# Patient Record
Sex: Female | Born: 1955 | Race: Black or African American | Hispanic: No | Smoking: Former smoker
Health system: Southern US, Community
[De-identification: ages and names within clinical notes are randomized; demographics above are authoritative.]

## PROBLEM LIST (undated history)

## (undated) DIAGNOSIS — E119 Type 2 diabetes mellitus without complications: Secondary | ICD-10-CM

## (undated) DIAGNOSIS — I10 Essential (primary) hypertension: Secondary | ICD-10-CM

## (undated) DIAGNOSIS — J449 Chronic obstructive pulmonary disease, unspecified: Secondary | ICD-10-CM

## (undated) DIAGNOSIS — I480 Paroxysmal atrial fibrillation: Secondary | ICD-10-CM

## (undated) DIAGNOSIS — I509 Heart failure, unspecified: Secondary | ICD-10-CM

## (undated) DIAGNOSIS — I272 Pulmonary hypertension, unspecified: Secondary | ICD-10-CM

## (undated) DIAGNOSIS — I2781 Cor pulmonale (chronic): Secondary | ICD-10-CM

## (undated) HISTORY — PX: OTHER SURGICAL HISTORY: SHX169

---

## 2017-07-14 ENCOUNTER — Inpatient Hospital Stay
Admission: RE | Admit: 2017-07-14 | Discharge: 2017-08-05 | Disposition: A | Discharge status: Home Health | Payer: Medicaid Other | Source: Other Acute Inpatient Hospital | Attending: Internal Medicine | Admitting: Internal Medicine

## 2017-07-14 ENCOUNTER — Other Ambulatory Visit (HOSPITAL_COMMUNITY): Payer: Medicaid Other

## 2017-07-14 DIAGNOSIS — J811 Chronic pulmonary edema: Secondary | ICD-10-CM

## 2017-07-14 DIAGNOSIS — R6889 Other general symptoms and signs: Secondary | ICD-10-CM

## 2017-07-14 DIAGNOSIS — J9690 Respiratory failure, unspecified, unspecified whether with hypoxia or hypercapnia: Secondary | ICD-10-CM

## 2017-07-15 ENCOUNTER — Other Ambulatory Visit (HOSPITAL_COMMUNITY): Payer: Medicaid Other

## 2017-07-15 LAB — CBC WITH DIFFERENTIAL/PLATELET
BASOS PCT: 0 %
Basophils Absolute: 0 10*3/uL (ref 0.0–0.1)
EOS ABS: 0.1 10*3/uL (ref 0.0–0.7)
Eosinophils Relative: 0 %
HCT: 42.2 % (ref 36.0–46.0)
HEMOGLOBIN: 12.2 g/dL (ref 12.0–15.0)
Lymphocytes Relative: 18 %
Lymphs Abs: 2.4 10*3/uL (ref 0.7–4.0)
MCH: 27.4 pg (ref 26.0–34.0)
MCHC: 28.9 g/dL — AB (ref 30.0–36.0)
MCV: 94.8 fL (ref 78.0–100.0)
Monocytes Absolute: 1.1 10*3/uL — ABNORMAL HIGH (ref 0.1–1.0)
Monocytes Relative: 8 %
NEUTROS PCT: 74 %
Neutro Abs: 10.4 10*3/uL — ABNORMAL HIGH (ref 1.7–7.7)
Platelets: 181 10*3/uL (ref 150–400)
RBC: 4.45 MIL/uL (ref 3.87–5.11)
RDW: 16.4 % — ABNORMAL HIGH (ref 11.5–15.5)
WBC: 13.9 10*3/uL — AB (ref 4.0–10.5)

## 2017-07-15 LAB — COMPREHENSIVE METABOLIC PANEL
ALK PHOS: 45 U/L (ref 38–126)
ALT: 16 U/L (ref 14–54)
ANION GAP: 10 (ref 5–15)
AST: 13 U/L — ABNORMAL LOW (ref 15–41)
Albumin: 3.3 g/dL — ABNORMAL LOW (ref 3.5–5.0)
BUN: 25 mg/dL — ABNORMAL HIGH (ref 6–20)
CALCIUM: 8.9 mg/dL (ref 8.9–10.3)
CO2: 36 mmol/L — ABNORMAL HIGH (ref 22–32)
CREATININE: 0.69 mg/dL (ref 0.44–1.00)
Chloride: 97 mmol/L — ABNORMAL LOW (ref 101–111)
Glucose, Bld: 167 mg/dL — ABNORMAL HIGH (ref 65–99)
Potassium: 3.4 mmol/L — ABNORMAL LOW (ref 3.5–5.1)
Sodium: 143 mmol/L (ref 135–145)
Total Bilirubin: 0.8 mg/dL (ref 0.3–1.2)
Total Protein: 6 g/dL — ABNORMAL LOW (ref 6.5–8.1)

## 2017-07-15 LAB — PROTIME-INR
INR: 0.95
PROTHROMBIN TIME: 12.6 s (ref 11.4–15.2)

## 2017-07-15 LAB — MAGNESIUM: Magnesium: 2.3 mg/dL (ref 1.7–2.4)

## 2017-07-16 LAB — PHOSPHORUS: Phosphorus: 4.2 mg/dL (ref 2.5–4.6)

## 2017-07-16 LAB — CBC
HCT: 40.7 % (ref 36.0–46.0)
Hemoglobin: 11.5 g/dL — ABNORMAL LOW (ref 12.0–15.0)
MCH: 26.9 pg (ref 26.0–34.0)
MCHC: 28.3 g/dL — ABNORMAL LOW (ref 30.0–36.0)
MCV: 95.3 fL (ref 78.0–100.0)
PLATELETS: 147 10*3/uL — AB (ref 150–400)
RBC: 4.27 MIL/uL (ref 3.87–5.11)
RDW: 16.6 % — AB (ref 11.5–15.5)
WBC: 12.4 10*3/uL — ABNORMAL HIGH (ref 4.0–10.5)

## 2017-07-16 LAB — BASIC METABOLIC PANEL
Anion gap: 14 (ref 5–15)
BUN: 26 mg/dL — AB (ref 6–20)
CO2: 35 mmol/L — AB (ref 22–32)
Calcium: 8.8 mg/dL — ABNORMAL LOW (ref 8.9–10.3)
Chloride: 94 mmol/L — ABNORMAL LOW (ref 101–111)
Creatinine, Ser: 0.69 mg/dL (ref 0.44–1.00)
GFR calc Af Amer: >60 mL/min (ref >60)
GLUCOSE: 78 mg/dL (ref 65–99)
Potassium: 4.1 mmol/L (ref 3.5–5.1)
Sodium: 143 mmol/L (ref 135–145)

## 2017-07-16 LAB — MAGNESIUM: Magnesium: 2.2 mg/dL (ref 1.7–2.4)

## 2017-07-16 LAB — TSH: TSH: 1.02 u[IU]/mL (ref 0.350–4.500)

## 2017-07-20 LAB — RENAL FUNCTION PANEL
ALBUMIN: 2.9 g/dL — AB (ref 3.5–5.0)
ANION GAP: 12 (ref 5–15)
BUN: 22 mg/dL — AB (ref 6–20)
CHLORIDE: 95 mmol/L — AB (ref 101–111)
CO2: 33 mmol/L — ABNORMAL HIGH (ref 22–32)
Calcium: 8.6 mg/dL — ABNORMAL LOW (ref 8.9–10.3)
Creatinine, Ser: 0.77 mg/dL (ref 0.44–1.00)
GFR calc Af Amer: >60 mL/min (ref >60)
Glucose, Bld: 127 mg/dL — ABNORMAL HIGH (ref 65–99)
PHOSPHORUS: 3.8 mg/dL (ref 2.5–4.6)
POTASSIUM: 3.7 mmol/L (ref 3.5–5.1)
Sodium: 140 mmol/L (ref 135–145)

## 2017-07-20 LAB — CBC
HEMATOCRIT: 36.9 % (ref 36.0–46.0)
HEMOGLOBIN: 10.9 g/dL — AB (ref 12.0–15.0)
MCH: 27.9 pg (ref 26.0–34.0)
MCHC: 29.5 g/dL — AB (ref 30.0–36.0)
MCV: 94.6 fL (ref 78.0–100.0)
Platelets: 157 10*3/uL (ref 150–400)
RBC: 3.9 MIL/uL (ref 3.87–5.11)
RDW: 16.6 % — AB (ref 11.5–15.5)
WBC: 9.7 10*3/uL (ref 4.0–10.5)

## 2017-07-20 LAB — MAGNESIUM: MAGNESIUM: 2.3 mg/dL (ref 1.7–2.4)

## 2017-07-20 LAB — DIGOXIN LEVEL: Digoxin Level: 0.8 ng/mL (ref 0.8–2.0)

## 2017-07-23 LAB — BASIC METABOLIC PANEL
Anion gap: 9 (ref 5–15)
BUN: 13 mg/dL (ref 6–20)
CALCIUM: 8.8 mg/dL — AB (ref 8.9–10.3)
CO2: 31 mmol/L (ref 22–32)
Chloride: 101 mmol/L (ref 101–111)
Creatinine, Ser: 0.64 mg/dL (ref 0.44–1.00)
GFR calc Af Amer: >60 mL/min (ref >60)
GLUCOSE: 128 mg/dL — AB (ref 65–99)
Potassium: 3.7 mmol/L (ref 3.5–5.1)
Sodium: 141 mmol/L (ref 135–145)

## 2017-07-24 ENCOUNTER — Other Ambulatory Visit (HOSPITAL_COMMUNITY): Payer: Medicaid Other

## 2017-07-25 ENCOUNTER — Other Ambulatory Visit (HOSPITAL_COMMUNITY): Payer: Medicaid Other

## 2017-07-25 ENCOUNTER — Other Ambulatory Visit (HOSPITAL_BASED_OUTPATIENT_CLINIC_OR_DEPARTMENT_OTHER): Payer: Medicare Other

## 2017-07-25 DIAGNOSIS — I340 Nonrheumatic mitral (valve) insufficiency: Secondary | ICD-10-CM | POA: Diagnosis not present

## 2017-07-25 LAB — CBC
HCT: 34.5 % — ABNORMAL LOW (ref 36.0–46.0)
Hemoglobin: 10.2 g/dL — ABNORMAL LOW (ref 12.0–15.0)
MCH: 27.9 pg (ref 26.0–34.0)
MCHC: 29.6 g/dL — AB (ref 30.0–36.0)
MCV: 94.5 fL (ref 78.0–100.0)
PLATELETS: 143 10*3/uL — AB (ref 150–400)
RBC: 3.65 MIL/uL — ABNORMAL LOW (ref 3.87–5.11)
RDW: 15.9 % — AB (ref 11.5–15.5)
WBC: 7.7 10*3/uL (ref 4.0–10.5)

## 2017-07-25 LAB — BASIC METABOLIC PANEL
ANION GAP: 14 (ref 5–15)
BUN: 16 mg/dL (ref 6–20)
CALCIUM: 8.7 mg/dL — AB (ref 8.9–10.3)
CO2: 29 mmol/L (ref 22–32)
CREATININE: 0.75 mg/dL (ref 0.44–1.00)
Chloride: 99 mmol/L — ABNORMAL LOW (ref 101–111)
GFR calc Af Amer: >60 mL/min (ref >60)
GLUCOSE: 181 mg/dL — AB (ref 65–99)
Potassium: 4.2 mmol/L (ref 3.5–5.1)
Sodium: 142 mmol/L (ref 135–145)

## 2017-07-25 NOTE — Progress Notes (Signed)
  Echocardiogram 2D Echocardiogram has been performed.  Vanessa Alexander 07/25/2017, 5:05 PM

## 2017-07-27 LAB — BASIC METABOLIC PANEL
Anion gap: 7 (ref 5–15)
BUN: 24 mg/dL — AB (ref 6–20)
CALCIUM: 9.4 mg/dL (ref 8.9–10.3)
CO2: 38 mmol/L — ABNORMAL HIGH (ref 22–32)
Chloride: 98 mmol/L — ABNORMAL LOW (ref 101–111)
Creatinine, Ser: 0.69 mg/dL (ref 0.44–1.00)
GFR calc Af Amer: >60 mL/min (ref >60)
Glucose, Bld: 121 mg/dL — ABNORMAL HIGH (ref 65–99)
POTASSIUM: 4 mmol/L (ref 3.5–5.1)
Sodium: 143 mmol/L (ref 135–145)

## 2017-07-27 LAB — BRAIN NATRIURETIC PEPTIDE: B NATRIURETIC PEPTIDE 5: 165.8 pg/mL — AB (ref 0.0–100.0)

## 2017-07-27 LAB — MAGNESIUM: Magnesium: 2.4 mg/dL (ref 1.7–2.4)

## 2017-07-28 MED ORDER — FUROSEMIDE 10 MG/ML IJ SOLN
20.00 | INTRAMUSCULAR | Status: DC
Start: 2017-07-14 — End: 2017-07-28

## 2017-07-28 MED ORDER — IPRATROPIUM BROMIDE 0.02 % IN SOLN
.50 | RESPIRATORY_TRACT | Status: DC
Start: 2017-07-14 — End: 2017-07-28

## 2017-07-28 MED ORDER — HYDRALAZINE HCL 20 MG/ML IJ SOLN
10.00 | INTRAMUSCULAR | Status: DC
Start: ? — End: 2017-07-28

## 2017-07-28 MED ORDER — ATORVASTATIN CALCIUM 40 MG PO TABS
40.00 | ORAL_TABLET | ORAL | Status: DC
Start: 2017-07-14 — End: 2017-07-28

## 2017-07-28 MED ORDER — HYDROXYZINE HCL 25 MG PO TABS
50.00 | ORAL_TABLET | ORAL | Status: DC
Start: ? — End: 2017-07-28

## 2017-07-28 MED ORDER — TRAZODONE HCL 100 MG PO TABS
100.00 | ORAL_TABLET | ORAL | Status: DC
Start: ? — End: 2017-07-28

## 2017-07-28 MED ORDER — ACETAMINOPHEN 325 MG PO TABS
650.00 | ORAL_TABLET | ORAL | Status: DC
Start: ? — End: 2017-07-28

## 2017-07-28 MED ORDER — ENOXAPARIN SODIUM 40 MG/0.4ML ~~LOC~~ SOLN
40.00 | SUBCUTANEOUS | Status: DC
Start: 2017-07-14 — End: 2017-07-28

## 2017-07-28 MED ORDER — ASPIRIN EC 81 MG PO TBEC
81.00 | DELAYED_RELEASE_TABLET | ORAL | Status: DC
Start: 2017-07-15 — End: 2017-07-28

## 2017-07-28 MED ORDER — DIGOXIN 125 MCG PO TABS
0.13 | ORAL_TABLET | ORAL | Status: DC
Start: 2017-07-15 — End: 2017-07-28

## 2017-07-28 MED ORDER — FLUTICASONE FUROATE-VILANTEROL 100-25 MCG/INH IN AEPB
1.00 | INHALATION_SPRAY | RESPIRATORY_TRACT | Status: DC
Start: 2017-07-15 — End: 2017-07-28

## 2017-07-28 MED ORDER — GLUCOSE 40 % PO GEL
15.00 g | ORAL | Status: DC
Start: ? — End: 2017-07-28

## 2017-07-28 MED ORDER — MAGNESIUM OXIDE 400 MG PO TABS
400.00 | ORAL_TABLET | ORAL | Status: DC
Start: 2017-07-14 — End: 2017-07-28

## 2017-07-28 MED ORDER — ONDANSETRON HCL 4 MG/2ML IJ SOLN
4.00 | INTRAMUSCULAR | Status: DC
Start: ? — End: 2017-07-28

## 2017-07-28 MED ORDER — ALBUTEROL SULFATE HFA 108 (90 BASE) MCG/ACT IN AERS
2.00 | INHALATION_SPRAY | RESPIRATORY_TRACT | Status: DC
Start: ? — End: 2017-07-28

## 2017-07-28 MED ORDER — GENERIC EXTERNAL MEDICATION
Status: DC
Start: ? — End: 2017-07-28

## 2017-07-28 MED ORDER — GENERIC EXTERNAL MEDICATION
1.00 | Status: DC
Start: ? — End: 2017-07-28

## 2017-07-28 MED ORDER — INSULIN LISPRO 100 UNIT/ML ~~LOC~~ SOLN
2.00 | SUBCUTANEOUS | Status: DC
Start: 2017-07-14 — End: 2017-07-28

## 2017-07-28 MED ORDER — AMLODIPINE BESYLATE 5 MG PO TABS
5.00 | ORAL_TABLET | ORAL | Status: DC
Start: 2017-07-15 — End: 2017-07-28

## 2017-07-28 MED ORDER — ALBUTEROL SULFATE (5 MG/ML) 0.5% IN NEBU
2.50 | INHALATION_SOLUTION | RESPIRATORY_TRACT | Status: DC
Start: 2017-07-14 — End: 2017-07-28

## 2017-07-28 MED ORDER — BUSPIRONE HCL 10 MG PO TABS
10.00 | ORAL_TABLET | ORAL | Status: DC
Start: 2017-07-14 — End: 2017-07-28

## 2017-07-28 MED ORDER — GLIPIZIDE ER 5 MG PO TB24
5.00 | ORAL_TABLET | ORAL | Status: DC
Start: 2017-07-15 — End: 2017-07-28

## 2017-07-28 MED ORDER — UMECLIDINIUM BROMIDE 62.5 MCG/INH IN AEPB
1.00 | INHALATION_SPRAY | RESPIRATORY_TRACT | Status: DC
Start: 2017-07-15 — End: 2017-07-28

## 2017-07-28 MED ORDER — DEXTROSE 50 % IV SOLN
12.00 g | INTRAVENOUS | Status: DC
Start: ? — End: 2017-07-28

## 2017-07-28 MED ORDER — METOPROLOL SUCCINATE ER 25 MG PO TB24
12.50 | ORAL_TABLET | ORAL | Status: DC
Start: 2017-07-15 — End: 2017-07-28

## 2017-07-29 ENCOUNTER — Other Ambulatory Visit (HOSPITAL_COMMUNITY): Payer: Medicaid Other

## 2017-07-29 LAB — BASIC METABOLIC PANEL
ANION GAP: 10 (ref 5–15)
BUN: 24 mg/dL — AB (ref 6–20)
CALCIUM: 9.1 mg/dL (ref 8.9–10.3)
CO2: 36 mmol/L — ABNORMAL HIGH (ref 22–32)
CREATININE: 0.77 mg/dL (ref 0.44–1.00)
Chloride: 99 mmol/L — ABNORMAL LOW (ref 101–111)
GFR calc Af Amer: >60 mL/min (ref >60)
GLUCOSE: 114 mg/dL — AB (ref 65–99)
Potassium: 3.6 mmol/L (ref 3.5–5.1)
Sodium: 145 mmol/L (ref 135–145)

## 2017-07-29 LAB — CBC
HCT: 36.3 % (ref 36.0–46.0)
Hemoglobin: 10.3 g/dL — ABNORMAL LOW (ref 12.0–15.0)
MCH: 27.5 pg (ref 26.0–34.0)
MCHC: 28.4 g/dL — ABNORMAL LOW (ref 30.0–36.0)
MCV: 97.1 fL (ref 78.0–100.0)
PLATELETS: 227 10*3/uL (ref 150–400)
RBC: 3.74 MIL/uL — ABNORMAL LOW (ref 3.87–5.11)
RDW: 15.8 % — AB (ref 11.5–15.5)
WBC: 9.2 10*3/uL (ref 4.0–10.5)

## 2017-07-30 LAB — BLOOD GAS, ARTERIAL
Acid-Base Excess: 15 mmol/L — ABNORMAL HIGH (ref 0.0–2.0)
BICARBONATE: 40.3 mmol/L — AB (ref 20.0–28.0)
Drawn by: 358491
FIO2: 80
O2 Content: 10 L/min
O2 Saturation: 96.9 %
PCO2 ART: 62.4 mmHg — AB (ref 32.0–48.0)
PH ART: 7.426 (ref 7.350–7.450)
PO2 ART: 91.7 mmHg (ref 83.0–108.0)
Patient temperature: 98.6

## 2017-07-31 LAB — RENAL FUNCTION PANEL
Albumin: 2.7 g/dL — ABNORMAL LOW (ref 3.5–5.0)
Anion gap: 8 (ref 5–15)
BUN: 21 mg/dL — AB (ref 6–20)
CHLORIDE: 95 mmol/L — AB (ref 101–111)
CO2: 39 mmol/L — AB (ref 22–32)
CREATININE: 0.65 mg/dL (ref 0.44–1.00)
Calcium: 8.9 mg/dL (ref 8.9–10.3)
GFR calc Af Amer: >60 mL/min (ref >60)
GFR calc non Af Amer: >60 mL/min (ref >60)
Glucose, Bld: 135 mg/dL — ABNORMAL HIGH (ref 65–99)
Phosphorus: 3.1 mg/dL (ref 2.5–4.6)
Potassium: 3.5 mmol/L (ref 3.5–5.1)
SODIUM: 142 mmol/L (ref 135–145)

## 2017-07-31 LAB — CBC
HCT: 33.4 % — ABNORMAL LOW (ref 36.0–46.0)
HEMOGLOBIN: 9.8 g/dL — AB (ref 12.0–15.0)
MCH: 27.9 pg (ref 26.0–34.0)
MCHC: 29.3 g/dL — AB (ref 30.0–36.0)
MCV: 95.2 fL (ref 78.0–100.0)
Platelets: 232 10*3/uL (ref 150–400)
RBC: 3.51 MIL/uL — ABNORMAL LOW (ref 3.87–5.11)
RDW: 15.7 % — AB (ref 11.5–15.5)
WBC: 8.3 10*3/uL (ref 4.0–10.5)

## 2017-07-31 LAB — MAGNESIUM: MAGNESIUM: 2.1 mg/dL (ref 1.7–2.4)

## 2017-07-31 LAB — DIGOXIN LEVEL: DIGOXIN LVL: 0.6 ng/mL — AB (ref 0.8–2.0)

## 2017-08-01 ENCOUNTER — Other Ambulatory Visit (HOSPITAL_COMMUNITY): Payer: Medicaid Other

## 2017-08-01 LAB — BASIC METABOLIC PANEL
Anion gap: 9 (ref 5–15)
BUN: 18 mg/dL (ref 6–20)
CALCIUM: 8.8 mg/dL — AB (ref 8.9–10.3)
CO2: 37 mmol/L — AB (ref 22–32)
CREATININE: 0.62 mg/dL (ref 0.44–1.00)
Chloride: 96 mmol/L — ABNORMAL LOW (ref 101–111)
Glucose, Bld: 114 mg/dL — ABNORMAL HIGH (ref 65–99)
Potassium: 3.5 mmol/L (ref 3.5–5.1)
SODIUM: 142 mmol/L (ref 135–145)

## 2017-08-01 LAB — TSH: TSH: 0.308 u[IU]/mL — ABNORMAL LOW (ref 0.350–4.500)

## 2017-08-01 LAB — HEMOGLOBIN A1C
HEMOGLOBIN A1C: 7.2 % — AB (ref 4.8–5.6)
Mean Plasma Glucose: 159.94 mg/dL

## 2017-08-01 LAB — MAGNESIUM: Magnesium: 2.2 mg/dL (ref 1.7–2.4)

## 2017-08-01 MED ORDER — MAGNESIUM OXIDE 400 MG PO TABS
400.00 | ORAL_TABLET | ORAL | Status: DC
Start: 2017-07-14 — End: 2017-08-01

## 2017-08-01 MED ORDER — GENERIC EXTERNAL MEDICATION
Status: DC
Start: ? — End: 2017-08-01

## 2017-08-04 LAB — CBC
HCT: 39.4 % (ref 36.0–46.0)
Hemoglobin: 11.3 g/dL — ABNORMAL LOW (ref 12.0–15.0)
MCH: 27.8 pg (ref 26.0–34.0)
MCHC: 28.7 g/dL — AB (ref 30.0–36.0)
MCV: 96.8 fL (ref 78.0–100.0)
PLATELETS: 260 10*3/uL (ref 150–400)
RBC: 4.07 MIL/uL (ref 3.87–5.11)
RDW: 16.2 % — AB (ref 11.5–15.5)
WBC: 11.5 10*3/uL — AB (ref 4.0–10.5)

## 2017-08-04 LAB — BASIC METABOLIC PANEL
ANION GAP: 8 (ref 5–15)
BUN: 20 mg/dL (ref 6–20)
CALCIUM: 9.5 mg/dL (ref 8.9–10.3)
CO2: 39 mmol/L — ABNORMAL HIGH (ref 22–32)
CREATININE: 0.77 mg/dL (ref 0.44–1.00)
Chloride: 97 mmol/L — ABNORMAL LOW (ref 101–111)
GLUCOSE: 111 mg/dL — AB (ref 65–99)
Potassium: 3.6 mmol/L (ref 3.5–5.1)
Sodium: 144 mmol/L (ref 135–145)

## 2017-08-04 LAB — MAGNESIUM: Magnesium: 2.3 mg/dL (ref 1.7–2.4)

## 2017-09-09 DIAGNOSIS — J9620 Acute and chronic respiratory failure, unspecified whether with hypoxia or hypercapnia: Secondary | ICD-10-CM

## 2017-09-09 DIAGNOSIS — J181 Lobar pneumonia, unspecified organism: Secondary | ICD-10-CM

## 2017-09-09 DIAGNOSIS — I5032 Chronic diastolic (congestive) heart failure: Secondary | ICD-10-CM

## 2017-09-09 DIAGNOSIS — J449 Chronic obstructive pulmonary disease, unspecified: Secondary | ICD-10-CM | POA: Diagnosis not present

## 2017-09-10 DIAGNOSIS — I5032 Chronic diastolic (congestive) heart failure: Secondary | ICD-10-CM

## 2017-09-10 DIAGNOSIS — J449 Chronic obstructive pulmonary disease, unspecified: Secondary | ICD-10-CM | POA: Diagnosis not present

## 2017-09-10 DIAGNOSIS — J181 Lobar pneumonia, unspecified organism: Secondary | ICD-10-CM

## 2017-09-10 DIAGNOSIS — J9620 Acute and chronic respiratory failure, unspecified whether with hypoxia or hypercapnia: Secondary | ICD-10-CM

## 2017-09-11 DIAGNOSIS — I5032 Chronic diastolic (congestive) heart failure: Secondary | ICD-10-CM

## 2017-09-11 DIAGNOSIS — J181 Lobar pneumonia, unspecified organism: Secondary | ICD-10-CM

## 2017-09-11 DIAGNOSIS — J9620 Acute and chronic respiratory failure, unspecified whether with hypoxia or hypercapnia: Secondary | ICD-10-CM

## 2017-09-11 DIAGNOSIS — J449 Chronic obstructive pulmonary disease, unspecified: Secondary | ICD-10-CM | POA: Diagnosis not present

## 2017-09-12 DIAGNOSIS — J449 Chronic obstructive pulmonary disease, unspecified: Secondary | ICD-10-CM

## 2017-09-12 DIAGNOSIS — J9620 Acute and chronic respiratory failure, unspecified whether with hypoxia or hypercapnia: Secondary | ICD-10-CM | POA: Diagnosis not present

## 2017-09-12 DIAGNOSIS — I5032 Chronic diastolic (congestive) heart failure: Secondary | ICD-10-CM

## 2017-09-12 DIAGNOSIS — J181 Lobar pneumonia, unspecified organism: Secondary | ICD-10-CM

## 2017-09-13 DIAGNOSIS — I5032 Chronic diastolic (congestive) heart failure: Secondary | ICD-10-CM

## 2017-09-13 DIAGNOSIS — J181 Lobar pneumonia, unspecified organism: Secondary | ICD-10-CM | POA: Diagnosis not present

## 2017-09-13 DIAGNOSIS — J449 Chronic obstructive pulmonary disease, unspecified: Secondary | ICD-10-CM

## 2017-09-13 DIAGNOSIS — J9620 Acute and chronic respiratory failure, unspecified whether with hypoxia or hypercapnia: Secondary | ICD-10-CM

## 2017-09-14 DIAGNOSIS — J181 Lobar pneumonia, unspecified organism: Secondary | ICD-10-CM

## 2017-09-14 DIAGNOSIS — J449 Chronic obstructive pulmonary disease, unspecified: Secondary | ICD-10-CM

## 2017-09-14 DIAGNOSIS — I5032 Chronic diastolic (congestive) heart failure: Secondary | ICD-10-CM

## 2017-09-14 DIAGNOSIS — J9620 Acute and chronic respiratory failure, unspecified whether with hypoxia or hypercapnia: Secondary | ICD-10-CM

## 2017-09-15 DIAGNOSIS — J181 Lobar pneumonia, unspecified organism: Secondary | ICD-10-CM

## 2017-09-15 DIAGNOSIS — J9620 Acute and chronic respiratory failure, unspecified whether with hypoxia or hypercapnia: Secondary | ICD-10-CM

## 2017-09-15 DIAGNOSIS — I5032 Chronic diastolic (congestive) heart failure: Secondary | ICD-10-CM | POA: Diagnosis not present

## 2017-09-15 DIAGNOSIS — J449 Chronic obstructive pulmonary disease, unspecified: Secondary | ICD-10-CM

## 2017-09-23 DIAGNOSIS — J9620 Acute and chronic respiratory failure, unspecified whether with hypoxia or hypercapnia: Secondary | ICD-10-CM

## 2017-09-23 DIAGNOSIS — J181 Lobar pneumonia, unspecified organism: Secondary | ICD-10-CM

## 2017-09-23 DIAGNOSIS — I5032 Chronic diastolic (congestive) heart failure: Secondary | ICD-10-CM

## 2017-09-23 DIAGNOSIS — J449 Chronic obstructive pulmonary disease, unspecified: Secondary | ICD-10-CM

## 2017-09-24 DIAGNOSIS — J9620 Acute and chronic respiratory failure, unspecified whether with hypoxia or hypercapnia: Secondary | ICD-10-CM

## 2017-09-24 DIAGNOSIS — J181 Lobar pneumonia, unspecified organism: Secondary | ICD-10-CM | POA: Diagnosis not present

## 2017-09-24 DIAGNOSIS — J449 Chronic obstructive pulmonary disease, unspecified: Secondary | ICD-10-CM | POA: Diagnosis not present

## 2017-09-24 DIAGNOSIS — I5032 Chronic diastolic (congestive) heart failure: Secondary | ICD-10-CM | POA: Diagnosis not present

## 2017-09-25 DIAGNOSIS — I5032 Chronic diastolic (congestive) heart failure: Secondary | ICD-10-CM | POA: Diagnosis not present

## 2017-09-25 DIAGNOSIS — J449 Chronic obstructive pulmonary disease, unspecified: Secondary | ICD-10-CM

## 2017-09-25 DIAGNOSIS — J181 Lobar pneumonia, unspecified organism: Secondary | ICD-10-CM

## 2017-09-25 DIAGNOSIS — J9620 Acute and chronic respiratory failure, unspecified whether with hypoxia or hypercapnia: Secondary | ICD-10-CM

## 2017-09-26 DIAGNOSIS — J181 Lobar pneumonia, unspecified organism: Secondary | ICD-10-CM | POA: Diagnosis not present

## 2017-09-26 DIAGNOSIS — J9620 Acute and chronic respiratory failure, unspecified whether with hypoxia or hypercapnia: Secondary | ICD-10-CM | POA: Diagnosis not present

## 2017-09-26 DIAGNOSIS — J449 Chronic obstructive pulmonary disease, unspecified: Secondary | ICD-10-CM

## 2017-09-26 DIAGNOSIS — I5032 Chronic diastolic (congestive) heart failure: Secondary | ICD-10-CM

## 2017-09-27 DIAGNOSIS — I5032 Chronic diastolic (congestive) heart failure: Secondary | ICD-10-CM | POA: Diagnosis not present

## 2017-09-27 DIAGNOSIS — J181 Lobar pneumonia, unspecified organism: Secondary | ICD-10-CM

## 2017-09-27 DIAGNOSIS — J449 Chronic obstructive pulmonary disease, unspecified: Secondary | ICD-10-CM

## 2017-09-27 DIAGNOSIS — J9620 Acute and chronic respiratory failure, unspecified whether with hypoxia or hypercapnia: Secondary | ICD-10-CM

## 2017-09-28 DIAGNOSIS — J181 Lobar pneumonia, unspecified organism: Secondary | ICD-10-CM

## 2017-09-28 DIAGNOSIS — J9620 Acute and chronic respiratory failure, unspecified whether with hypoxia or hypercapnia: Secondary | ICD-10-CM | POA: Diagnosis not present

## 2017-09-28 DIAGNOSIS — I5032 Chronic diastolic (congestive) heart failure: Secondary | ICD-10-CM | POA: Diagnosis not present

## 2017-09-28 DIAGNOSIS — J449 Chronic obstructive pulmonary disease, unspecified: Secondary | ICD-10-CM | POA: Diagnosis not present

## 2017-09-29 DIAGNOSIS — J449 Chronic obstructive pulmonary disease, unspecified: Secondary | ICD-10-CM

## 2017-09-29 DIAGNOSIS — J181 Lobar pneumonia, unspecified organism: Secondary | ICD-10-CM | POA: Diagnosis not present

## 2017-09-29 DIAGNOSIS — J9620 Acute and chronic respiratory failure, unspecified whether with hypoxia or hypercapnia: Secondary | ICD-10-CM

## 2017-09-29 DIAGNOSIS — I5032 Chronic diastolic (congestive) heart failure: Secondary | ICD-10-CM

## 2017-10-10 ENCOUNTER — Encounter (HOSPITAL_COMMUNITY): Payer: Self-pay

## 2017-10-10 ENCOUNTER — Other Ambulatory Visit: Payer: Self-pay

## 2017-10-10 ENCOUNTER — Emergency Department (HOSPITAL_COMMUNITY): Payer: Medicare Other

## 2017-10-10 ENCOUNTER — Inpatient Hospital Stay (HOSPITAL_COMMUNITY)
Admission: EM | Admit: 2017-10-10 | Discharge: 2017-10-22 | DRG: 193 | Disposition: A | Discharge status: Hospice | Payer: Medicare Other | Attending: Internal Medicine | Admitting: Internal Medicine

## 2017-10-10 DIAGNOSIS — E870 Hyperosmolality and hypernatremia: Secondary | ICD-10-CM | POA: Diagnosis not present

## 2017-10-10 DIAGNOSIS — Z515 Encounter for palliative care: Secondary | ICD-10-CM | POA: Diagnosis not present

## 2017-10-10 DIAGNOSIS — B953 Streptococcus pneumoniae as the cause of diseases classified elsewhere: Secondary | ICD-10-CM | POA: Diagnosis not present

## 2017-10-10 DIAGNOSIS — I119 Hypertensive heart disease without heart failure: Secondary | ICD-10-CM | POA: Diagnosis present

## 2017-10-10 DIAGNOSIS — N179 Acute kidney failure, unspecified: Secondary | ICD-10-CM | POA: Diagnosis present

## 2017-10-10 DIAGNOSIS — L98429 Non-pressure chronic ulcer of back with unspecified severity: Secondary | ICD-10-CM | POA: Diagnosis present

## 2017-10-10 DIAGNOSIS — I481 Persistent atrial fibrillation: Secondary | ICD-10-CM | POA: Diagnosis present

## 2017-10-10 DIAGNOSIS — I2723 Pulmonary hypertension due to lung diseases and hypoxia: Secondary | ICD-10-CM | POA: Diagnosis present

## 2017-10-10 DIAGNOSIS — E11649 Type 2 diabetes mellitus with hypoglycemia without coma: Secondary | ICD-10-CM | POA: Diagnosis not present

## 2017-10-10 DIAGNOSIS — I509 Heart failure, unspecified: Secondary | ICD-10-CM | POA: Diagnosis not present

## 2017-10-10 DIAGNOSIS — I2781 Cor pulmonale (chronic): Secondary | ICD-10-CM | POA: Diagnosis present

## 2017-10-10 DIAGNOSIS — I2722 Pulmonary hypertension due to left heart disease: Secondary | ICD-10-CM | POA: Diagnosis present

## 2017-10-10 DIAGNOSIS — J439 Emphysema, unspecified: Secondary | ICD-10-CM | POA: Diagnosis present

## 2017-10-10 DIAGNOSIS — Z79899 Other long term (current) drug therapy: Secondary | ICD-10-CM

## 2017-10-10 DIAGNOSIS — Z794 Long term (current) use of insulin: Secondary | ICD-10-CM

## 2017-10-10 DIAGNOSIS — Z7901 Long term (current) use of anticoagulants: Secondary | ICD-10-CM

## 2017-10-10 DIAGNOSIS — Z7952 Long term (current) use of systemic steroids: Secondary | ICD-10-CM

## 2017-10-10 DIAGNOSIS — T501X5A Adverse effect of loop [high-ceiling] diuretics, initial encounter: Secondary | ICD-10-CM | POA: Diagnosis present

## 2017-10-10 DIAGNOSIS — J189 Pneumonia, unspecified organism: Secondary | ICD-10-CM | POA: Diagnosis present

## 2017-10-10 DIAGNOSIS — J9622 Acute and chronic respiratory failure with hypercapnia: Secondary | ICD-10-CM | POA: Diagnosis present

## 2017-10-10 DIAGNOSIS — E118 Type 2 diabetes mellitus with unspecified complications: Secondary | ICD-10-CM | POA: Diagnosis not present

## 2017-10-10 DIAGNOSIS — Z9981 Dependence on supplemental oxygen: Secondary | ICD-10-CM

## 2017-10-10 DIAGNOSIS — J430 Unilateral pulmonary emphysema [MacLeod's syndrome]: Secondary | ICD-10-CM | POA: Diagnosis not present

## 2017-10-10 DIAGNOSIS — R0602 Shortness of breath: Secondary | ICD-10-CM | POA: Diagnosis present

## 2017-10-10 DIAGNOSIS — E872 Acidosis, unspecified: Secondary | ICD-10-CM | POA: Diagnosis present

## 2017-10-10 DIAGNOSIS — Z87891 Personal history of nicotine dependence: Secondary | ICD-10-CM

## 2017-10-10 DIAGNOSIS — J9620 Acute and chronic respiratory failure, unspecified whether with hypoxia or hypercapnia: Secondary | ICD-10-CM | POA: Diagnosis present

## 2017-10-10 DIAGNOSIS — E119 Type 2 diabetes mellitus without complications: Secondary | ICD-10-CM

## 2017-10-10 DIAGNOSIS — N183 Chronic kidney disease, stage 3 (moderate): Secondary | ICD-10-CM

## 2017-10-10 DIAGNOSIS — J811 Chronic pulmonary edema: Secondary | ICD-10-CM | POA: Diagnosis present

## 2017-10-10 DIAGNOSIS — I10 Essential (primary) hypertension: Secondary | ICD-10-CM | POA: Diagnosis present

## 2017-10-10 DIAGNOSIS — E1165 Type 2 diabetes mellitus with hyperglycemia: Secondary | ICD-10-CM | POA: Diagnosis present

## 2017-10-10 DIAGNOSIS — Y95 Nosocomial condition: Secondary | ICD-10-CM | POA: Diagnosis present

## 2017-10-10 DIAGNOSIS — I5033 Acute on chronic diastolic (congestive) heart failure: Secondary | ICD-10-CM | POA: Diagnosis present

## 2017-10-10 DIAGNOSIS — J449 Chronic obstructive pulmonary disease, unspecified: Secondary | ICD-10-CM | POA: Diagnosis present

## 2017-10-10 DIAGNOSIS — Z7951 Long term (current) use of inhaled steroids: Secondary | ICD-10-CM

## 2017-10-10 DIAGNOSIS — R748 Abnormal levels of other serum enzymes: Secondary | ICD-10-CM

## 2017-10-10 DIAGNOSIS — J9621 Acute and chronic respiratory failure with hypoxia: Secondary | ICD-10-CM | POA: Diagnosis present

## 2017-10-10 DIAGNOSIS — I471 Supraventricular tachycardia: Secondary | ICD-10-CM | POA: Diagnosis present

## 2017-10-10 DIAGNOSIS — E875 Hyperkalemia: Secondary | ICD-10-CM | POA: Diagnosis not present

## 2017-10-10 DIAGNOSIS — Z66 Do not resuscitate: Secondary | ICD-10-CM | POA: Diagnosis not present

## 2017-10-10 DIAGNOSIS — J13 Pneumonia due to Streptococcus pneumoniae: Secondary | ICD-10-CM | POA: Diagnosis present

## 2017-10-10 DIAGNOSIS — E871 Hypo-osmolality and hyponatremia: Secondary | ICD-10-CM | POA: Diagnosis not present

## 2017-10-10 DIAGNOSIS — Z7189 Other specified counseling: Secondary | ICD-10-CM | POA: Diagnosis not present

## 2017-10-10 DIAGNOSIS — R7881 Bacteremia: Secondary | ICD-10-CM | POA: Diagnosis present

## 2017-10-10 DIAGNOSIS — R7989 Other specified abnormal findings of blood chemistry: Secondary | ICD-10-CM | POA: Diagnosis present

## 2017-10-10 DIAGNOSIS — I361 Nonrheumatic tricuspid (valve) insufficiency: Secondary | ICD-10-CM | POA: Diagnosis not present

## 2017-10-10 DIAGNOSIS — R778 Other specified abnormalities of plasma proteins: Secondary | ICD-10-CM | POA: Diagnosis present

## 2017-10-10 DIAGNOSIS — J9601 Acute respiratory failure with hypoxia: Secondary | ICD-10-CM

## 2017-10-10 DIAGNOSIS — Z8701 Personal history of pneumonia (recurrent): Secondary | ICD-10-CM

## 2017-10-10 DIAGNOSIS — F419 Anxiety disorder, unspecified: Secondary | ICD-10-CM | POA: Diagnosis present

## 2017-10-10 DIAGNOSIS — I248 Other forms of acute ischemic heart disease: Secondary | ICD-10-CM | POA: Diagnosis present

## 2017-10-10 DIAGNOSIS — R0600 Dyspnea, unspecified: Secondary | ICD-10-CM | POA: Diagnosis not present

## 2017-10-10 DIAGNOSIS — E1169 Type 2 diabetes mellitus with other specified complication: Secondary | ICD-10-CM | POA: Diagnosis not present

## 2017-10-10 DIAGNOSIS — I959 Hypotension, unspecified: Secondary | ICD-10-CM | POA: Diagnosis not present

## 2017-10-10 DIAGNOSIS — I480 Paroxysmal atrial fibrillation: Secondary | ICD-10-CM | POA: Diagnosis present

## 2017-10-10 DIAGNOSIS — F39 Unspecified mood [affective] disorder: Secondary | ICD-10-CM | POA: Diagnosis present

## 2017-10-10 DIAGNOSIS — Z885 Allergy status to narcotic agent status: Secondary | ICD-10-CM

## 2017-10-10 DIAGNOSIS — R0902 Hypoxemia: Secondary | ICD-10-CM

## 2017-10-10 DIAGNOSIS — J810 Acute pulmonary edema: Secondary | ICD-10-CM | POA: Diagnosis not present

## 2017-10-10 DIAGNOSIS — Z888 Allergy status to other drugs, medicaments and biological substances status: Secondary | ICD-10-CM

## 2017-10-10 HISTORY — DX: Essential (primary) hypertension: I10

## 2017-10-10 HISTORY — DX: Cor pulmonale (chronic): I27.81

## 2017-10-10 HISTORY — DX: Pulmonary hypertension, unspecified: I27.20

## 2017-10-10 HISTORY — DX: Chronic obstructive pulmonary disease, unspecified: J44.9

## 2017-10-10 HISTORY — DX: Type 2 diabetes mellitus without complications: E11.9

## 2017-10-10 HISTORY — DX: Paroxysmal atrial fibrillation: I48.0

## 2017-10-10 HISTORY — DX: Heart failure, unspecified: I50.9

## 2017-10-10 LAB — BLOOD GAS, VENOUS
Acid-Base Excess: 6.1 mmol/L — ABNORMAL HIGH (ref 0.0–2.0)
Bicarbonate: 33.3 mmol/L — ABNORMAL HIGH (ref 20.0–28.0)
Delivery systems: POSITIVE
Drawn by: 257881
FIO2: 40
MODE: POSITIVE
O2 Saturation: 34.4 %
PEEP/CPAP: 8 cmH2O
PH VEN: 7.319 (ref 7.250–7.430)
PRESSURE SUPPORT: 16 cmH2O
Patient temperature: 98.6
pCO2, Ven: 66.7 mmHg — ABNORMAL HIGH (ref 44.0–60.0)

## 2017-10-10 LAB — TROPONIN I
TROPONIN I: 0.1 ng/mL — AB (ref <0.03)
TROPONIN I: 0.1 ng/mL — AB (ref <0.03)
Troponin I: 0.1 ng/mL (ref <0.03)

## 2017-10-10 LAB — BLOOD GAS, ARTERIAL
Acid-Base Excess: 7.5 mmol/L — ABNORMAL HIGH (ref 0.0–2.0)
Acid-Base Excess: 5.9 mmol/L — ABNORMAL HIGH (ref 0.0–2.0)
BICARBONATE: 31.7 mmol/L — AB (ref 20.0–28.0)
Bicarbonate: 33.6 mmol/L — ABNORMAL HIGH (ref 20.0–28.0)
DELIVERY SYSTEMS: POSITIVE
DRAWN BY: 295031
Delivery systems: POSITIVE
Drawn by: 257881
Expiratory PAP: 8
Expiratory PAP: 8
FIO2: 60
FIO2: 50
INSPIRATORY PAP: 16
Inspiratory PAP: 16
MODE: POSITIVE
Mode: POSITIVE
O2 Saturation: 89.7 %
O2 Saturation: 88.3 %
PATIENT TEMPERATURE: 98.6
PCO2 ART: 56.9 mmHg — AB (ref 32.0–48.0)
PO2 ART: 63.7 mmHg — AB (ref 83.0–108.0)
Patient temperature: 98.6
pCO2 arterial: 55.8 mmHg — ABNORMAL HIGH (ref 32.0–48.0)
pH, Arterial: 7.389 (ref 7.350–7.450)
pH, Arterial: 7.374 (ref 7.350–7.450)
pO2, Arterial: 68.3 mmHg — ABNORMAL LOW (ref 83.0–108.0)

## 2017-10-10 LAB — COMPREHENSIVE METABOLIC PANEL
ALK PHOS: 107 U/L (ref 38–126)
ALT: 38 U/L (ref 0–44)
AST: 27 U/L (ref 15–41)
Albumin: 3.1 g/dL — ABNORMAL LOW (ref 3.5–5.0)
Anion gap: 15 (ref 5–15)
BILIRUBIN TOTAL: 1.1 mg/dL (ref 0.3–1.2)
BUN: 38 mg/dL — ABNORMAL HIGH (ref 8–23)
CALCIUM: 8.7 mg/dL — AB (ref 8.9–10.3)
CO2: 31 mmol/L (ref 22–32)
CREATININE: 1.24 mg/dL — AB (ref 0.44–1.00)
Chloride: 95 mmol/L — ABNORMAL LOW (ref 98–111)
GFR calc non Af Amer: 45 mL/min — ABNORMAL LOW (ref >60)
GFR, EST AFRICAN AMERICAN: 52 mL/min — AB (ref >60)
Glucose, Bld: 247 mg/dL — ABNORMAL HIGH (ref 70–99)
Potassium: 5.2 mmol/L — ABNORMAL HIGH (ref 3.5–5.1)
Sodium: 141 mmol/L (ref 135–145)
TOTAL PROTEIN: 6.8 g/dL (ref 6.5–8.1)

## 2017-10-10 LAB — CBC WITH DIFFERENTIAL/PLATELET
Basophils Absolute: 0 10*3/uL (ref 0.0–0.1)
Basophils Relative: 0 %
EOS ABS: 0 10*3/uL (ref 0.0–0.7)
Eosinophils Relative: 0 %
HEMATOCRIT: 36.3 % (ref 36.0–46.0)
HEMOGLOBIN: 10.8 g/dL — AB (ref 12.0–15.0)
LYMPHS ABS: 0.7 10*3/uL (ref 0.7–4.0)
LYMPHS PCT: 3 %
MCH: 28.3 pg (ref 26.0–34.0)
MCHC: 29.8 g/dL — AB (ref 30.0–36.0)
MCV: 95.3 fL (ref 78.0–100.0)
MONOS PCT: 5 %
Monocytes Absolute: 1.1 10*3/uL — ABNORMAL HIGH (ref 0.1–1.0)
NEUTROS ABS: 20.6 10*3/uL — AB (ref 1.7–7.7)
NEUTROS PCT: 92 %
Platelets: 181 10*3/uL (ref 150–400)
RBC: 3.81 MIL/uL — AB (ref 3.87–5.11)
RDW: 16.4 % — ABNORMAL HIGH (ref 11.5–15.5)
WBC: 22.4 10*3/uL — AB (ref 4.0–10.5)

## 2017-10-10 LAB — GLUCOSE, CAPILLARY
Glucose-Capillary: 284 mg/dL — ABNORMAL HIGH (ref 70–99)
Glucose-Capillary: 228 mg/dL — ABNORMAL HIGH (ref 70–99)

## 2017-10-10 LAB — I-STAT CG4 LACTIC ACID, ED: LACTIC ACID, VENOUS: 2.92 mmol/L — AB (ref 0.5–1.9)

## 2017-10-10 LAB — BRAIN NATRIURETIC PEPTIDE: B NATRIURETIC PEPTIDE 5: 726.3 pg/mL — AB (ref 0.0–100.0)

## 2017-10-10 LAB — PROCALCITONIN: Procalcitonin: 2.25 ng/mL

## 2017-10-10 MED ORDER — ATORVASTATIN CALCIUM 40 MG PO TABS
40.0000 mg | ORAL_TABLET | Freq: Every day | ORAL | Status: DC
  Administered 2017-10-11 – 2017-10-22 (×10): 40 mg via ORAL
  Filled 2017-10-10 (×11): qty 1

## 2017-10-10 MED ORDER — ONDANSETRON HCL 4 MG/2ML IJ SOLN: 4.0000 mg | Freq: Four times a day (QID) | INTRAMUSCULAR | Status: DC | PRN

## 2017-10-10 MED ORDER — POTASSIUM CHLORIDE CRYS ER 20 MEQ PO TBCR: 40.0000 meq | EXTENDED_RELEASE_TABLET | Freq: Every day | ORAL | Status: DC

## 2017-10-10 MED ORDER — HALOPERIDOL LACTATE 5 MG/ML IJ SOLN: 2.5000 mg | Freq: Once | INTRAMUSCULAR | Status: DC

## 2017-10-10 MED ORDER — ENOXAPARIN SODIUM 40 MG/0.4ML ~~LOC~~ SOLN
40.0000 mg | SUBCUTANEOUS | Status: DC
  Administered 2017-10-10 – 2017-10-19 (×9): 40 mg via SUBCUTANEOUS
  Filled 2017-10-10 (×9): qty 0.4

## 2017-10-10 MED ORDER — LORAZEPAM 2 MG/ML IJ SOLN: 0.5000 mg | Freq: Once | INTRAMUSCULAR | Status: DC

## 2017-10-10 MED ORDER — ARFORMOTEROL TARTRATE 15 MCG/2ML IN NEBU
15.0000 ug | INHALATION_SOLUTION | Freq: Two times a day (BID) | RESPIRATORY_TRACT | Status: DC
  Administered 2017-10-10 – 2017-10-22 (×24): 15 ug via RESPIRATORY_TRACT
  Filled 2017-10-10 (×24): qty 2

## 2017-10-10 MED ORDER — DILTIAZEM HCL ER COATED BEADS 180 MG PO CP24
180.0000 mg | ORAL_CAPSULE | Freq: Every day | ORAL | Status: DC
Start: 2017-10-10 — End: 2017-10-17
  Administered 2017-10-10 – 2017-10-16 (×7): 180 mg via ORAL
  Filled 2017-10-10 (×7): qty 1

## 2017-10-10 MED ORDER — SODIUM CHLORIDE 0.9 % IV BOLUS
250.0000 mL | Freq: Once | INTRAVENOUS | Status: AC
  Administered 2017-10-10: 250 mL via INTRAVENOUS

## 2017-10-10 MED ORDER — DIGOXIN 125 MCG PO TABS
0.1250 mg | ORAL_TABLET | Freq: Every day | ORAL | Status: DC
  Administered 2017-10-10 – 2017-10-22 (×11): 0.125 mg via ORAL
  Filled 2017-10-10 (×11): qty 1

## 2017-10-10 MED ORDER — METHYLPREDNISOLONE SODIUM SUCC 125 MG IJ SOLR
60.0000 mg | Freq: Two times a day (BID) | INTRAMUSCULAR | Status: DC
  Administered 2017-10-10 – 2017-10-14 (×8): 60 mg via INTRAVENOUS
  Filled 2017-10-10 (×8): qty 2

## 2017-10-10 MED ORDER — ACETAMINOPHEN 325 MG PO TABS: 650.0000 mg | ORAL_TABLET | Freq: Four times a day (QID) | ORAL | Status: DC | PRN

## 2017-10-10 MED ORDER — FUROSEMIDE 10 MG/ML IJ SOLN
40.0000 mg | Freq: Two times a day (BID) | INTRAMUSCULAR | Status: DC
  Administered 2017-10-10 – 2017-10-11 (×3): 40 mg via INTRAVENOUS
  Filled 2017-10-10 (×3): qty 4

## 2017-10-10 MED ORDER — MORPHINE SULFATE 15 MG PO TABS
7.5000 mg | ORAL_TABLET | Freq: Two times a day (BID) | ORAL | Status: DC | PRN
  Administered 2017-10-11: 7.5 mg via ORAL
  Filled 2017-10-10: qty 1

## 2017-10-10 MED ORDER — BUDESONIDE 0.5 MG/2ML IN SUSP
0.5000 mg | Freq: Two times a day (BID) | RESPIRATORY_TRACT | Status: DC
  Administered 2017-10-10 – 2017-10-22 (×24): 0.5 mg via RESPIRATORY_TRACT
  Filled 2017-10-10 (×24): qty 2

## 2017-10-10 MED ORDER — ALBUTEROL (5 MG/ML) CONTINUOUS INHALATION SOLN
10.0000 mg/h | INHALATION_SOLUTION | Freq: Once | RESPIRATORY_TRACT | Status: AC
  Administered 2017-10-10: 10 mg/h via RESPIRATORY_TRACT
  Filled 2017-10-10: qty 20

## 2017-10-10 MED ORDER — SODIUM CHLORIDE 0.9 % IV SOLN
2.0000 g | Freq: Once | INTRAVENOUS | Status: AC
  Administered 2017-10-10: 2 g via INTRAVENOUS
  Filled 2017-10-10: qty 2

## 2017-10-10 MED ORDER — VANCOMYCIN HCL 10 G IV SOLR
1500.0000 mg | Freq: Once | INTRAVENOUS | Status: AC
  Administered 2017-10-10: 1500 mg via INTRAVENOUS
  Filled 2017-10-10: qty 1500

## 2017-10-10 MED ORDER — ONDANSETRON HCL 4 MG PO TABS: 4.0000 mg | ORAL_TABLET | Freq: Four times a day (QID) | ORAL | Status: DC | PRN

## 2017-10-10 MED ORDER — SENNOSIDES-DOCUSATE SODIUM 8.6-50 MG PO TABS
2.0000 | ORAL_TABLET | Freq: Every day | ORAL | Status: DC | PRN
  Administered 2017-10-17: 2 via ORAL
  Filled 2017-10-10: qty 2

## 2017-10-10 MED ORDER — PANTOPRAZOLE SODIUM 40 MG PO TBEC
40.0000 mg | DELAYED_RELEASE_TABLET | Freq: Two times a day (BID) | ORAL | Status: DC
  Administered 2017-10-10 – 2017-10-19 (×18): 40 mg via ORAL
  Filled 2017-10-10 (×18): qty 1

## 2017-10-10 MED ORDER — BUSPIRONE HCL 10 MG PO TABS
10.0000 mg | ORAL_TABLET | Freq: Three times a day (TID) | ORAL | Status: DC
Start: 2017-10-10 — End: 2017-10-17
  Administered 2017-10-10 – 2017-10-17 (×21): 10 mg via ORAL
  Filled 2017-10-10 (×21): qty 1

## 2017-10-10 MED ORDER — METOPROLOL SUCCINATE ER 25 MG PO TB24
25.0000 mg | ORAL_TABLET | Freq: Every day | ORAL | Status: DC
  Administered 2017-10-10 – 2017-10-12 (×3): 25 mg via ORAL
  Filled 2017-10-10 (×3): qty 1

## 2017-10-10 MED ORDER — IPRATROPIUM-ALBUTEROL 0.5-2.5 (3) MG/3ML IN SOLN
3.0000 mL | RESPIRATORY_TRACT | Status: DC
  Administered 2017-10-10: 3 mL via RESPIRATORY_TRACT
  Filled 2017-10-10: qty 3

## 2017-10-10 MED ORDER — ACETAMINOPHEN 650 MG RE SUPP: 650.0000 mg | Freq: Four times a day (QID) | RECTAL | Status: DC | PRN

## 2017-10-10 MED ORDER — GUAIFENESIN-DM 100-10 MG/5ML PO SYRP: 5.0000 mL | ORAL_SOLUTION | ORAL | Status: DC | PRN

## 2017-10-10 MED ORDER — IPRATROPIUM BROMIDE 0.02 % IN SOLN
0.5000 mg | Freq: Once | RESPIRATORY_TRACT | Status: AC
  Administered 2017-10-10: 0.5 mg via RESPIRATORY_TRACT
  Filled 2017-10-10: qty 2.5

## 2017-10-10 MED ORDER — INSULIN ASPART 100 UNIT/ML ~~LOC~~ SOLN
0.0000 [IU] | Freq: Three times a day (TID) | SUBCUTANEOUS | Status: DC
  Administered 2017-10-10: 8 [IU] via SUBCUTANEOUS
  Administered 2017-10-11: 3 [IU] via SUBCUTANEOUS
  Administered 2017-10-11: 5 [IU] via SUBCUTANEOUS
  Administered 2017-10-11: 8 [IU] via SUBCUTANEOUS
  Administered 2017-10-12: 5 [IU] via SUBCUTANEOUS
  Administered 2017-10-12: 11 [IU] via SUBCUTANEOUS
  Administered 2017-10-12: 2 [IU] via SUBCUTANEOUS
  Administered 2017-10-13: 5 [IU] via SUBCUTANEOUS
  Administered 2017-10-13: 3 [IU] via SUBCUTANEOUS
  Administered 2017-10-13 – 2017-10-14 (×2): 8 [IU] via SUBCUTANEOUS
  Administered 2017-10-14 (×2): 11 [IU] via SUBCUTANEOUS
  Administered 2017-10-15: 8 [IU] via SUBCUTANEOUS
  Administered 2017-10-15: 5 [IU] via SUBCUTANEOUS
  Administered 2017-10-15: 11 [IU] via SUBCUTANEOUS
  Administered 2017-10-16: 8 [IU] via SUBCUTANEOUS
  Administered 2017-10-16 – 2017-10-17 (×3): 5 [IU] via SUBCUTANEOUS
  Administered 2017-10-18 (×2): 2 [IU] via SUBCUTANEOUS
  Administered 2017-10-19 (×2): 3 [IU] via SUBCUTANEOUS
  Administered 2017-10-21: 5 [IU] via SUBCUTANEOUS

## 2017-10-10 MED ORDER — MIRTAZAPINE 15 MG PO TABS
15.0000 mg | ORAL_TABLET | Freq: Every day | ORAL | Status: DC
Start: 2017-10-10 — End: 2017-10-16
  Administered 2017-10-10 – 2017-10-15 (×6): 15 mg via ORAL
  Filled 2017-10-10 (×6): qty 1

## 2017-10-10 MED ORDER — VANCOMYCIN HCL IN DEXTROSE 750-5 MG/150ML-% IV SOLN: 750.0000 mg | INTRAVENOUS | Status: DC

## 2017-10-10 MED ORDER — SODIUM CHLORIDE 0.9 % IV SOLN
2.0000 g | Freq: Two times a day (BID) | INTRAVENOUS | Status: DC
  Administered 2017-10-10: 2 g via INTRAVENOUS
  Filled 2017-10-10 (×2): qty 2

## 2017-10-10 MED ORDER — IPRATROPIUM-ALBUTEROL 0.5-2.5 (3) MG/3ML IN SOLN
3.0000 mL | Freq: Three times a day (TID) | RESPIRATORY_TRACT | Status: DC
  Administered 2017-10-10 – 2017-10-22 (×35): 3 mL via RESPIRATORY_TRACT
  Filled 2017-10-10 (×35): qty 3

## 2017-10-10 MED ORDER — MAGNESIUM OXIDE 400 (241.3 MG) MG PO TABS
400.0000 mg | ORAL_TABLET | Freq: Two times a day (BID) | ORAL | Status: DC
  Administered 2017-10-10 – 2017-10-19 (×18): 400 mg via ORAL
  Filled 2017-10-10 (×19): qty 1

## 2017-10-10 NOTE — ED Notes (Signed)
Istat Lactic was given to MD.

## 2017-10-10 NOTE — ED Provider Notes (Signed)
[facility] DEPT Provider Note   CSN: [id number] Arrival date & time: 10/10/17  1106     History   Chief Complaint Chief Complaint  Patient presents with  . Shortness of Breath    HPI Vanessa Alexander is a 62 y.o. female.  HPI   62 year old female with extensive past medical history including COPD, chronic heart failure, recent admission for COPD and pneumonia approximately a month ago at [facility] here with shortness of breath.  The patient reportedly just left her skilled nursing facility after a short stay for rehab following her hospitalization.  She states that she began to feel short of breath while in the facility, but this was not addressed.  She returned home and then had worsening shortness of breath as well as worsening bilateral leg pain.  She said associated swelling in her legs.  She endorses cough and general fatigue.  No known fevers.  She said poor appetite.'s been unable to eat or drink much.  She is unable to get around her house on her baseline 6 L nasal cannula.  EMS was subsequently called.  EMS placed patient on BiPAP, gave her steroids, and brought her to the ED.  No past medical history on file.   Chronic hypoxic respiratory failure on 6 L COPD Type 2 diabetes Pulmonary hypertension COPD  There are no active problems to display for this patient.  No relevant surgical history  Social history  No longer smoking    OB History   None      Home Medications    Prior to Admission medications   Not on File    Family History No family history on file.  Social History Social History   Tobacco Use  . Smoking status: Not on file  Substance Use Topics  . Alcohol use: Not on file  . Drug use: Not on file     Allergies   Codeine   Review of Systems Review of Systems  Constitutional: Positive for fatigue. Negative for chills and fever.  HENT: Negative for congestion, rhinorrhea and sore throat.    Eyes: Negative for visual disturbance.  Respiratory: Positive for cough and shortness of breath. Negative for wheezing.   Cardiovascular: Negative for chest pain and leg swelling.  Gastrointestinal: Negative for abdominal pain, diarrhea, nausea and vomiting.  Genitourinary: Negative for dysuria, flank pain, vaginal bleeding and vaginal discharge.  Musculoskeletal: Negative for neck pain.  Skin: Negative for rash.  Allergic/Immunologic: Negative for immunocompromised state.  Neurological: Positive for weakness. Negative for syncope and headaches.  Hematological: Does not bruise/bleed easily.  All other systems reviewed and are negative.    Physical Exam Updated Vital Signs BP 115/77   Pulse 97   Resp 16   Ht _0  (1.499 m)   Wt 77.1 kg (170 lb)   SpO2 97%   BMI 34.34 kg/m   Physical Exam  Constitutional: She is oriented to person, place, and time. She appears well-developed and well-nourished. She appears ill. No distress.  HENT:  Head: Normocephalic and atraumatic.  Eyes: Conjunctivae are normal.  Neck: Neck supple.  Cardiovascular: Normal rate, regular rhythm and normal heart sounds. Exam reveals no friction rub.  No murmur heard. Pulmonary/Chest: Effort normal. Tachypnea noted. No respiratory distress. She has decreased breath sounds. She has no wheezes. She has rales in the right middle field, the right lower field, the left middle field and the left lower field.  Abdominal: She exhibits no distension.  Musculoskeletal: She  exhibits no edema.  Neurological: She is alert and oriented to person, place, and time. She exhibits normal muscle tone.  Skin: Skin is warm. Capillary refill takes less than 2 seconds.  Psychiatric: She has a normal mood and affect.  Nursing note and vitals reviewed.    ED Treatments / Results  Labs (all labs ordered are listed, but only abnormal results are displayed) Labs Reviewed  BLOOD GAS, ARTERIAL - Abnormal; Notable for the following  components:      Result Value   pCO2 arterial 56.9 (*)    pO2, Arterial 68.3 (*)    Bicarbonate 33.6 (*)    Acid-Base Excess 7.5 (*)    All other components within normal limits  CBC WITH DIFFERENTIAL/PLATELET - Abnormal; Notable for the following components:   WBC 22.4 (*)    RBC 3.81 (*)    Hemoglobin 10.8 (*)    MCHC 29.8 (*)    RDW 16.4 (*)    Neutro Abs 20.6 (*)    Monocytes Absolute 1.1 (*)    All other components within normal limits  COMPREHENSIVE METABOLIC PANEL - Abnormal; Notable for the following components:   Potassium 5.2 (*)    Chloride 95 (*)    Glucose, Bld 247 (*)    BUN 38 (*)    Creatinine, Ser 1.24 (*)    Calcium 8.7 (*)    Albumin 3.1 (*)    GFR calc non Af Amer 45 (*)    GFR calc Af Amer 52 (*)    All other components within normal limits  TROPONIN I - Abnormal; Notable for the following components:   Troponin I 0.10 (*)    All other components within normal limits  BRAIN NATRIURETIC PEPTIDE - Abnormal; Notable for the following components:   B Natriuretic Peptide 726.3 (*)    All other components within normal limits  I-STAT CG4 LACTIC ACID, ED - Abnormal; Notable for the following components:   Lactic Acid, Venous 2.92 (*)    All other components within normal limits  CULTURE, BLOOD (ROUTINE X 2)  CULTURE, BLOOD (ROUTINE X 2)  BLOOD GAS, VENOUS    EKG None  Radiology Dg Chest Portable 1 View  Result Date: 10/10/2017 CLINICAL DATA:  Increased shortness of breath. Difficulty breathing. EXAM: PORTABLE CHEST 1 VIEW COMPARISON:  Sep 03, 2017 FINDINGS: No pneumothorax. Mild bibasilar opacities are stable on the right and more pronounced on the left in the interval. No overt edema. No change in the cardiomediastinal silhouette. IMPRESSION: Bibasilar opacities again identified, stable on the right and more pronounced on the left. No other interval change. Electronically Signed   By: Dorise Bullion III M.D   On: 10/10/2017 12:02     Procedures .Critical Care Performed by: Duffy Bruce, MD Authorized by: Duffy Bruce, MD   Critical care provider statement:    Critical care time (minutes):  35   Critical care time was exclusive of:  Separately billable procedures and treating other patients and teaching time   Critical care was necessary to treat or prevent imminent or life-threatening deterioration of the following conditions:  Circulatory failure, cardiac failure, sepsis and respiratory failure   Critical care was time spent personally by me on the following activities:  Development of treatment plan with patient or surrogate, discussions with consultants, evaluation of patient's response to treatment, examination of patient, obtaining history from patient or surrogate, ordering and performing treatments and interventions, ordering and review of laboratory studies, ordering and review of radiographic studies,  pulse oximetry, re-evaluation of patient's condition and review of old charts   I assumed direction of critical care for this patient from another provider in my specialty: no     (including critical care time)  Medications Ordered in ED Medications  vancomycin (VANCOCIN) 1,500 mg in sodium chloride 0.9 % 500 mL IVPB (1,500 mg Intravenous New Bag/Given 10/10/17 1325)  sodium chloride 0.9 % bolus 250 mL (has no administration in time range)  ceFEPIme (MAXIPIME) 2 g in sodium chloride 0.9 % 100 mL IVPB (0 g Intravenous Stopped 10/10/17 1312)  albuterol (PROVENTIL,VENTOLIN) solution continuous neb (10 mg/hr Nebulization Given 10/10/17 1315)  ipratropium (ATROVENT) nebulizer solution 0.5 mg (0.5 mg Nebulization Given 10/10/17 1315)     Initial Impression / Assessment and Plan / ED Course  I have reviewed the triage vital signs and the nursing notes.  Pertinent labs & imaging results that were available during my care of the patient were reviewed by me and considered in my medical decision making (see chart  for details).     62 year old female here with acute on chronic shortness of breath.  Patient in moderate respiratory distress on BiPAP on arrival.  Concern for acute on chronic hypoxic respiratory failure, likely secondary to pneumonia based on her clinical exam, though must also consider CHF and COPD.  She seems to be mentating well on her BiPAP.  Will continue breathing treatments, send labs and chest x-ray.  Labs and x-ray reviewed.  Patient has acute kidney injury with elevated BUN likely due to dehydration in the setting of poor p.o. intake.  Fluid resuscitation complicated by the fact that she also has pitting bilateral lower extremity edema.  Given that she is tolerating BiPAP with a moderate lactic acid elevation and AKI likely from dehydration, will give 250 cc boluses at a time and reassess.  Chest x-ray shows bibasilar opacities.  Broad-spectrum antibiotics and blood culture sent.  Repeat blood gas shows that she is not retaining.  Will admit to stepdown.  Final Clinical Impressions(s) / ED Diagnoses   Final diagnoses:  HCAP (healthcare-associated pneumonia)  Acute on chronic respiratory failure with hypoxia [facility])    ED Discharge Orders    None       Duffy Bruce, MD 10/10/17 1334

## 2017-10-10 NOTE — ED Notes (Signed)
Bed: WA02 Expected date:  Expected time:  Means of arrival:  Comments: EMS-COPD

## 2017-10-10 NOTE — H&P (Signed)
History and Physical    Vanessa Alexander ZHY:[id number] DOB: 1956/02/22  DOA: 10/10/2017 PCP: Townsend Roger, MD  Patient coming from: Home  Chief Complaint: SOB  HPI: Vanessa Alexander is a 62 y.o. 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  Patient open BiPAP during my interview, however wake up and answer questions.  Patient was hospitalized 1 month ago at [facility] due to COPD exacerbation and pneumonia and was discharged to SNF for short-term rehab.  She just left the facility 1 day ago.  Patient reported that she was having difficulty breathing and worsening of her baseline shortness of breath for the past week or so and apparently this was not addressed.  Patient was unable to walk around her house with her baseline 6 L nasal cannula, developing cough and generalized fatigue.  Patient denies chest pain, palpitations and dizziness.  ED Course: EMS found patient with oxygen saturation in the low 80s, gave her steroids and placed on BiPAP.  Labs remarkable for WBC of 22, chest x-ray with increasing vascular congestion, and bibasilar opacities.  Elevated creatinine at 1.7 and mild elevated lactic acid at 2.9.  Patient was given IV fluids and antibiotics by EDP.  Review of Systems:   All 10 points reviewed negative, except for the ones mentioned in HPI.  Past Medical History:  Diagnosis Date  . CHF (congestive heart failure) ([place])   . COPD (chronic obstructive pulmonary disease) ([place])   . Cor pulmonale (chronic) ([place])   . Diabetes mellitus without complication ([place])   . Hypertension   . Paroxysmal atrial fibrillation ([facility])   . Pulmonary hypertension ([place])     Past Surgical History:  Procedure Laterality Date  . Left Breast Lumpectomy       reports that she quit smoking about 4 years ago. Her smoking use  included cigarettes. She has never used smokeless tobacco. She reports that she does not drink alcohol or use drugs.  Allergies  Allergen Reactions  . Codeine     History reviewed. No pertinent family history.  Prior to Admission medications   Not on File    Physical Exam: Vitals:   10/10/17 1200 10/10/17 1230 10/10/17 1330 10/10/17 1400  BP: 113/73 115/77 109/77 103/69  Pulse:   96   Resp: _0 SpO2:   93% 93%  Weight:      Height:         Constitutional: NAD, on BiPAP Eyes: PERRL, lids and conjunctivae normal Neck: normal, supple, no masses, no thyromegaly, no LAD Respiratory: clear to auscultation bilaterally, no wheezing, no crackles. Normal respiratory effort. Cardiovascular: Regular rate and rhythm, no murmurs / rubs / gallops.  2+ pedal pulses.  At around 3+ pitting edema Abdomen: no tenderness, no masses palpated. No hepatosplenomegaly. Bowel sounds positive.  Musculoskeletal: no clubbing / cyanosis. No joint deformity upper and lower extremities.  Skin: no rashes. No induration Neurologic: CN 2-12 grossly intact. Sensation intact, Psychiatric: Alert and oriented x 3. Normal mood.   Labs on Admission: I have personally reviewed following labs and imaging studies  CBC: Recent Labs  Lab 10/10/17 1134  WBC 22.4*  NEUTROABS 20.6*  HGB 10.8*  HCT 36.3  MCV 95.3  PLT 295   Basic Metabolic Panel: Recent Labs  Lab 10/10/17 1134  NA 141  K 5.2*  CL 95*  CO2 31  GLUCOSE 247*  BUN 38*  CREATININE 1.24*  CALCIUM 8.7*   GFR: Estimated Creatinine Clearance: 41.1 mL/min (A) (by C-G formula based on SCr of 1.24 mg/dL (H)). Liver Function Tests: Recent Labs  Lab 10/10/17 1134  AST 27  ALT 38  ALKPHOS 107  BILITOT 1.1  PROT 6.8  ALBUMIN 3.1*   No results for input(s): LIPASE, AMYLASE in the last 168 hours. No results for input(s): AMMONIA in the last 168 hours. Coagulation Profile: No results for input(s): INR, PROTIME in the last 168  hours. Cardiac Enzymes: Recent Labs  Lab 10/10/17 1134  TROPONINI 0.10*   BNP (last 3 results) No results for input(s): PROBNP in the last 8760 hours. HbA1C: No results for input(s): HGBA1C in the last 72 hours. CBG: No results for input(s): GLUCAP in the last 168 hours. Lipid Profile: No results for input(s): CHOL, HDL, LDLCALC, TRIG, CHOLHDL, LDLDIRECT in the last 72 hours. Thyroid Function Tests: No results for input(s): TSH, T4TOTAL, FREET4, T3FREE, THYROIDAB in the last 72 hours. Anemia Panel: No results for input(s): VITAMINB12, FOLATE, FERRITIN, TIBC, IRON, RETICCTPCT in the last 72 hours. Urine analysis: No results found for: COLORURINE, APPEARANCEUR, LABSPEC, PHURINE, GLUCOSEU, HGBUR, BILIRUBINUR, KETONESUR, PROTEINUR, UROBILINOGEN, NITRITE, LEUKOCYTESUR Sepsis Labs: !!!!!!!!!!!!!!!!!!!!!!!!!!!!!!!!!!!!!!!!!!!! _0 (procalcitonin:4,lacticidven:4) )No results found for this or any previous visit (from the past 240 hour(s)).   Radiological Exams on Admission: Dg Chest Portable 1 View  Result Date: 10/10/2017 CLINICAL DATA:  Increased shortness of breath. Difficulty breathing. EXAM: PORTABLE CHEST 1 VIEW COMPARISON:  Sep 03, 2017 FINDINGS: No pneumothorax. Mild bibasilar opacities are stable on the right and more pronounced on the left in the interval. No overt edema. No change in the cardiomediastinal silhouette. IMPRESSION: Bibasilar opacities again identified, stable on the right and more pronounced on the left. No other interval change. Electronically Signed   By: Dorise Bullion III M.D   On: 10/10/2017 12:02   EKG: Independently reviewed.  Tachycardia with occasional PVCs  Assessment/Plan Acute on chronic respiratory failure with hypoxia and hypercarbia Multifactorial, likely PNA, COPD and CHF exacerbation  Admit to SDU, agree with BiPAP for now wean as able.  Treat underlying causes.  Acute on chronic diastolic heart failure  Seem to be in cardiorenal  syndrome, CXR looks wet, BNP 726, mild elevated troponin, decreased breath sounds and significant lower extremity edema.  Will treat with Lasix 40 mg IV twice daily, stop IV fluids.  Strict INO's, daily weights & low-salt diet.  Healthcare associated pneumonia Patient recently discharged [facility] due to COPD and pneumonia, was at skilled nursing facility.  Presented with cough, elevated WBC. Will treat as HCAP with cefepime and vancomycin for now.  Check procalcitonin.  Follow-up blood cultures.  Continue supportive treatment.   COPD No signs of clear exacerbation at this time, no wheezing.  However given pneumonia and history will treat with nebulizer treatment, Pulmicort and Brovana.  DuoNeb's and Robitussin for cough.  He on BiPAP wean as able.  AKI - cardiorenal syndrome Suspect improvement with Lasix Patient was treated with IVF in the ED  Check creatinine in a.m.  Lactic acidosis Felt to be related to hypoxia, treat underlying causes.  Elevated troponin Likely demand ischemia from CHF exacerbation EKG with no ischemic changes.  Continue to monitor trend.  Hypertension BP stable, home meds resumed   Type 2 diabetes mellitus Monitor CBG, Hold Lantus while NPO. Placed in [place]. Check A1C Holding oral hypoglycemic agents    DVT prophylaxis: Lovenox  Code Status: Full code Family Communication: None at bedside Disposition Plan: Anticipate discharge to previous home environment.  Consults called: None Admission status: SDU/inpatient  Chipper Oman MD [facility] Pager: Text Page via [email or web address]  [phone]  If 7PM-7AM, please contact night-coverage [email or web address] Password [email or web address]  10/10/2017, 2:21 PM

## 2017-10-10 NOTE — Progress Notes (Signed)
Pharmacy Antibiotic Note  Vanessa Alexander is a 62 y.o. female admitted on 10/10/2017 with SOB,  pneumonia.  She had a recent admission at [facility] for COPD/PNA and was discharged to SNF.  Pharmacy has been consulted for Cefepime, Vancomycin dosing.  SCr 1.24, CrCl ~ 41 ml/min WBC  22.4 Lactic acid 2.92  Plan:  Cefepime 2g IV q12h  Vancomycin 1500 mg IV x1 then 750 mg IV q36h.  Check vancomycin levels if remains on vancomycin > 3-4 days.  Goal AUC 400-500.  Follow up renal fxn, culture results, and clinical course.  F/u ability to de-escalate antibiotics.   Height: _0  (149.9 cm) Weight: 170 lb (77.1 kg) IBW/kg (Calculated) : 43.2  No data recorded.  Recent Labs  Lab 10/10/17 1134 10/10/17 1254  WBC 22.4*  --   CREATININE 1.24*  --   LATICACIDVEN  --  2.92*    Estimated Creatinine Clearance: 41.1 mL/min (A) (by C-G formula based on SCr of 1.24 mg/dL (H)).    Allergies  Allergen Reactions  . Codeine     Antimicrobials this admission: 6/27 Vancomycin >>  6/27 Cefepime >>   Dose adjustments this admission:   Microbiology results: 6/27 BCx:   Thank you for allowing pharmacy to be a part of this patient's care.  Gretta Arab PharmD, BCPS Pager [phone] 10/10/2017 3:21 PM

## 2017-10-10 NOTE — Progress Notes (Signed)
A consult was received from an ED physician for Vancomycin & Cefepime per pharmacy dosing.  The patient's profile has been reviewed for ht/wt/allergies/indication/available labs.   A one time order has been placed for Vancomycin 1515m IV & Cefepime 2gm IV.  Further antibiotics/pharmacy consults should be ordered by admitting physician if indicated.                       Thank you, LBiagio Borg6/27/2019  12:26 PM

## 2017-10-10 NOTE — ED Notes (Signed)
Report given to Bubba Hales, [place]  ED TO INPATIENT HANDOFF REPORT  Name/Age/Gender Vanessa Alexander 62 y.o. female  Code Status    Code Status Orders  (From admission, onward)        Start     Ordered   10/10/17 1420  Full code  Continuous     10/10/17 1420    Code Status History    Date Active Date Inactive Code Status Order ID Comments User Context   07/26/2017 2033 08/05/2017 1825 Full Code [id number]  [facility] Inpatient    Advance Directive Documentation     Most Recent Value  Type of Advance Directive  Healthcare Power of [place]  Pre-existing out of facility DNR order (yellow form or pink MOST form)  -  "MOST" Form in Place?  -      Home/SNF/Other Home  Chief Complaint copd  Level of Care/Admitting Diagnosis ED Disposition    ED Disposition Condition [place]: [place] [[id number]]  Level of Care: Stepdown [14]  Admit to SDU based on following criteria: Severe physiological/psychological symptoms:  Any diagnosis requiring assessment & intervention at least every 4 hours on an ongoing basis to obtain desired patient outcomes including stability and rehabilitation  Diagnosis: Acute on chronic respiratory failure [facility]) [[id number]]  Admitting Physician: Patrecia Pour, EDWIN [[id number]]  Attending Physician: Patrecia Pour, EDWIN [[id number]]  Estimated length of stay: past midnight tomorrow  Certification:: I certify this patient will need inpatient services for at least 2 midnights  PT Class (Do Not Modify): Inpatient [101]  PT Acc Code (Do Not Modify): Private [1]       Medical History Past Medical History:  Diagnosis Date  . CHF (congestive heart failure) ([place])   . COPD (chronic obstructive pulmonary disease) ([place])   . Cor pulmonale (chronic) ([place])   . Diabetes mellitus without complication ([place])   . Hypertension   . Paroxysmal atrial fibrillation (HCC)   . Pulmonary hypertension (HCC)     Allergies Allergies   Allergen Reactions  . Codeine     IV Location/Drains/Wounds Patient Lines/Drains/Airways Status   Active Line/Drains/Airways    Name:   Placement date:   Placement time:   Site:   Days:   Peripheral IV 10/10/17 Right Antecubital   10/10/17    1219    Antecubital   less than 1   Peripheral IV 10/10/17 Left Antecubital   10/10/17    1220    Antecubital   less than 1          Labs/Imaging Results for orders placed or performed during the hospital encounter of 10/10/17 (from the past 48 hour(s))  CBC with Differential     Status: Abnormal   Collection Time: 10/10/17 11:34 AM  Result Value Ref Range   WBC 22.4 (H) 4.0 - 10.5 K/uL   RBC 3.81 (L) 3.87 - 5.11 MIL/uL   Hemoglobin 10.8 (L) 12.0 - 15.0 g/dL   HCT 36.3 36.0 - 46.0 %   MCV 95.3 78.0 - 100.0 fL   MCH 28.3 26.0 - 34.0 pg   MCHC 29.8 (L) 30.0 - 36.0 g/dL   RDW 16.4 (H) 11.5 - 15.5 %   Platelets 181 150 - 400 K/uL   Neutrophils Relative % 92 %   Neutro Abs 20.6 (H) 1.7 - 7.7 K/uL   Lymphocytes Relative 3 %   Lymphs Abs 0.7 0.7 - 4.0 K/uL   Monocytes Relative 5 %   Monocytes Absolute 1.1 (H)  0.1 - 1.0 K/uL   Eosinophils Relative 0 %   Eosinophils Absolute 0.0 0.0 - 0.7 K/uL   Basophils Relative 0 %   Basophils Absolute 0.0 0.0 - 0.1 K/uL    Comment: Performed at [facility], [place] [address] [place], [place] [address]  Comprehensive metabolic panel     Status: Abnormal   Collection Time: 10/10/17 11:34 AM  Result Value Ref Range   Sodium 141 135 - 145 mmol/L   Potassium 5.2 (H) 3.5 - 5.1 mmol/L    Comment: NO VISIBLE HEMOLYSIS   Chloride 95 (L) 98 - 111 mmol/L    Comment: Please note change in reference range.   CO2 31 22 - 32 mmol/L   Glucose, Bld 247 (H) 70 - 99 mg/dL    Comment: Please note change in reference range.   BUN 38 (H) 8 - 23 mg/dL    Comment: Please note change in reference range.   Creatinine, Ser 1.24 (H) 0.44 - 1.00 mg/dL   Calcium 8.7 (L) 8.9 - 10.3 mg/dL   Total Protein  6.8 6.5 - 8.1 g/dL   Albumin 3.1 (L) 3.5 - 5.0 g/dL   AST 27 15 - 41 U/L   ALT 38 0 - 44 U/L    Comment: Please note change in reference range.   Alkaline Phosphatase 107 38 - 126 U/L   Total Bilirubin 1.1 0.3 - 1.2 mg/dL   GFR calc non Af Amer 45 (L) >60 mL/min   GFR calc Af Amer 52 (L) >60 mL/min    Comment: (NOTE) The eGFR has been calculated using the CKD EPI equation. This calculation has not been validated in all clinical situations. eGFR's persistently <60 mL/min signify possible Chronic Kidney Disease.    Anion gap 15 5 - 15    Comment: Performed at [facility], [place] [address], [place], [place] [address]  Troponin I     Status: Abnormal   Collection Time: 10/10/17 11:34 AM  Result Value Ref Range   Troponin I 0.10 (HH) <0.03 ng/mL    Comment: CRITICAL RESULT CALLED TO, READ BACK BY AND VERIFIED WITH: WEST,S. RN AT 9675 10/10/17 MULLINS,T Performed at [facility], [place] [address], [place], [place] [address]   Brain natriuretic peptide     Status: Abnormal   Collection Time: 10/10/17 11:34 AM  Result Value Ref Range   B Natriuretic Peptide 726.3 (H) 0.0 - 100.0 pg/mL    Comment: Performed at [facility], [place] [address], [place], [place] [address]  Blood gas, arterial ([facility] & [facility] ONLY)     Status: Abnormal   Collection Time: 10/10/17 11:42 AM  Result Value Ref Range   FIO2 60.00    Delivery systems BILEVEL POSITIVE AIRWAY PRESSURE    Mode BILEVEL POSITIVE AIRWAY PRESSURE    Inspiratory PAP 16    Expiratory PAP 8    pH, Arterial 7.389 7.350 - 7.450   pCO2 arterial 56.9 (H) 32.0 - 48.0 mmHg   pO2, Arterial 68.3 (L) 83.0 - 108.0 mmHg   Bicarbonate 33.6 (H) 20.0 - 28.0 mmol/L   Acid-Base Excess 7.5 (H) 0.0 - 2.0 mmol/L   O2 Saturation 89.7 %   Patient temperature 98.6    Collection site RIGHT RADIAL    Drawn by [id number]    Sample type ARTERIAL DRAW    Allens test (pass/fail) PASS PASS    Comment: Performed at  [facility], [place] [address]  [place], [place] [address]  I-Stat CG4 Lactic Acid, ED     Status: Abnormal   Collection Time: 10/10/17 12:54 PM  Result Value Ref Range   Lactic Acid, Venous 2.92 (HH) 0.5 - 1.9 mmol/L   Comment NOTIFIED PHYSICIAN   Blood gas, venous     Status: Abnormal   Collection Time: 10/10/17  1:32 PM  Result Value Ref Range   FIO2 40.00    Delivery systems BILEVEL POSITIVE AIRWAY PRESSURE    Mode BILEVEL POSITIVE AIRWAY PRESSURE    Peep/cpap 8.0 cm H20   Pressure support 16 cm H20   pH, Ven 7.319 7.250 - 7.430   pCO2, Ven 66.7 (H) 44.0 - 60.0 mmHg   pO2, Ven  32.0 - 45.0 mmHg    CRITICAL RESULT CALLED TO, READ BACK BY AND VERIFIED WITH:    Comment: BELOW REPORTABLE RANGE DR. ISAACS BY ROBIN POWELL RRT ON 09/2717 AT 1332   Bicarbonate 33.3 (H) 20.0 - 28.0 mmol/L   Acid-Base Excess 6.1 (H) 0.0 - 2.0 mmol/L   O2 Saturation 34.4 %   Patient temperature 98.6    Collection site DRAWN BY RN    Drawn by [phone]    Sample type DRAWN BY RN     Comment: Performed at [facility] [address] [place], [place] [address]   Dg Chest Portable 1 View  Result Date: 10/10/2017 CLINICAL DATA:  Increased shortness of breath. Difficulty breathing. EXAM: PORTABLE CHEST 1 VIEW COMPARISON:  Sep 03, 2017 FINDINGS: No pneumothorax. Mild bibasilar opacities are stable on the right and more pronounced on the left in the interval. No overt edema. No change in the cardiomediastinal silhouette. IMPRESSION: Bibasilar opacities again identified, stable on the right and more pronounced on the left. No other interval change. Electronically Signed   By: Dorise Bullion III M.D   On: 10/10/2017 12:02    Pending Labs Unresulted Labs (From admission, onward)   Start     Ordered   10/17/17 0500  Creatinine, serum  (enoxaparin (LOVENOX)    CrCl >/= 30 ml/min)  Weekly,   R    Comments:  while on enoxaparin therapy    10/10/17 1420   10/11/17 0500   Comprehensive metabolic panel  Tomorrow morning,   R     10/10/17 1420   10/11/17 0500  CBC  Tomorrow morning,   R     10/10/17 1420   10/10/17 1420  Troponin I  Now then every 6 hours,   R     10/10/17 1420   10/10/17 1419  HIV antibody (Routine Testing)  Once,   R     10/10/17 1420   10/10/17 1208  Blood culture (routine x 2)  BLOOD CULTURE X 2,   STAT     10/10/17 1207      Vitals/Pain Today's Vitals   10/10/17 1200 10/10/17 1230 10/10/17 1330 10/10/17 1400  BP: 113/73 115/77 109/77 103/69  Pulse:   96   Resp: _0 SpO2:   93% 93%  Weight:      Height:      PainSc:        Isolation Precautions No active isolations  Medications Medications  vancomycin (VANCOCIN) 1,500 mg in sodium chloride 0.9 % 500 mL IVPB (1,500 mg Intravenous New Bag/Given 10/10/17 1325)  sodium chloride 0.9 % bolus 250 mL (0 mLs Intravenous Stopped 10/10/17 1459)  furosemide (LASIX) injection 40 mg (has no administration in time range)  enoxaparin (  LOVENOX) injection 40 mg (has no administration in time range)  acetaminophen (TYLENOL) tablet 650 mg (has no administration in time range)    Or  acetaminophen (TYLENOL) suppository 650 mg (has no administration in time range)  ondansetron (ZOFRAN) tablet 4 mg (has no administration in time range)    Or  ondansetron (ZOFRAN) injection 4 mg (has no administration in time range)  ceFEPIme (MAXIPIME) 2 g in sodium chloride 0.9 % 100 mL IVPB (0 g Intravenous Stopped 10/10/17 1312)  albuterol (PROVENTIL,VENTOLIN) solution continuous neb (10 mg/hr Nebulization Given 10/10/17 1315)  ipratropium (ATROVENT) nebulizer solution 0.5 mg (0.5 mg Nebulization Given 10/10/17 1315)    Mobility non-ambulatory

## 2017-10-10 NOTE — ED Triage Notes (Signed)
Patient arrives via EMS with c/o SOB, patient recently discharged for COPD exacerbation. When EMS arrived, patient on own CPAP with breathing tx running. EMS gave 2 duo-neb, 125 solumedrol, Patient on 6L [place] at home. Baseline sats 88% at home.  BP 125/64 P 96 RR 32 CBG 305 O2 sat: 89%

## 2017-10-10 NOTE — Progress Notes (Signed)
RT placed Pt on BIPAP due to the Pt's SATS being low and she was drowsy. Pt was retaining fluid and RT obtained and ABG 10-15 minutes after the Pt was placed on BIPAP. Rt will continue to monitor.

## 2017-10-11 ENCOUNTER — Inpatient Hospital Stay (HOSPITAL_COMMUNITY): Payer: Medicare Other

## 2017-10-11 DIAGNOSIS — N179 Acute kidney failure, unspecified: Secondary | ICD-10-CM | POA: Diagnosis present

## 2017-10-11 DIAGNOSIS — J189 Pneumonia, unspecified organism: Secondary | ICD-10-CM | POA: Diagnosis present

## 2017-10-11 DIAGNOSIS — I10 Essential (primary) hypertension: Secondary | ICD-10-CM

## 2017-10-11 DIAGNOSIS — Z79899 Other long term (current) drug therapy: Secondary | ICD-10-CM

## 2017-10-11 DIAGNOSIS — E875 Hyperkalemia: Secondary | ICD-10-CM

## 2017-10-11 DIAGNOSIS — E872 Acidosis, unspecified: Secondary | ICD-10-CM | POA: Diagnosis present

## 2017-10-11 DIAGNOSIS — J449 Chronic obstructive pulmonary disease, unspecified: Secondary | ICD-10-CM | POA: Diagnosis present

## 2017-10-11 DIAGNOSIS — E1169 Type 2 diabetes mellitus with other specified complication: Secondary | ICD-10-CM

## 2017-10-11 DIAGNOSIS — R7989 Other specified abnormal findings of blood chemistry: Secondary | ICD-10-CM | POA: Diagnosis present

## 2017-10-11 DIAGNOSIS — I509 Heart failure, unspecified: Secondary | ICD-10-CM

## 2017-10-11 DIAGNOSIS — R778 Other specified abnormalities of plasma proteins: Secondary | ICD-10-CM | POA: Diagnosis present

## 2017-10-11 DIAGNOSIS — Z87891 Personal history of nicotine dependence: Secondary | ICD-10-CM

## 2017-10-11 DIAGNOSIS — I5033 Acute on chronic diastolic (congestive) heart failure: Secondary | ICD-10-CM | POA: Diagnosis present

## 2017-10-11 DIAGNOSIS — E119 Type 2 diabetes mellitus without complications: Secondary | ICD-10-CM

## 2017-10-11 DIAGNOSIS — E118 Type 2 diabetes mellitus with unspecified complications: Secondary | ICD-10-CM

## 2017-10-11 DIAGNOSIS — B953 Streptococcus pneumoniae as the cause of diseases classified elsewhere: Secondary | ICD-10-CM

## 2017-10-11 DIAGNOSIS — R7881 Bacteremia: Secondary | ICD-10-CM

## 2017-10-11 LAB — BLOOD CULTURE ID PANEL (REFLEXED)
ACINETOBACTER BAUMANNII: NOT DETECTED
CANDIDA PARAPSILOSIS: NOT DETECTED
CANDIDA TROPICALIS: NOT DETECTED
Candida albicans: NOT DETECTED
Candida glabrata: NOT DETECTED
Candida krusei: NOT DETECTED
Enterobacter cloacae complex: NOT DETECTED
Enterobacteriaceae species: NOT DETECTED
Enterococcus species: NOT DETECTED
Escherichia coli: NOT DETECTED
HAEMOPHILUS INFLUENZAE: NOT DETECTED
KLEBSIELLA OXYTOCA: NOT DETECTED
KLEBSIELLA PNEUMONIAE: NOT DETECTED
Listeria monocytogenes: NOT DETECTED
NEISSERIA MENINGITIDIS: NOT DETECTED
PROTEUS SPECIES: NOT DETECTED
Pseudomonas aeruginosa: NOT DETECTED
STAPHYLOCOCCUS SPECIES: NOT DETECTED
STREPTOCOCCUS AGALACTIAE: NOT DETECTED
STREPTOCOCCUS SPECIES: DETECTED — AB
Serratia marcescens: NOT DETECTED
Staphylococcus aureus (BCID): NOT DETECTED
Streptococcus pneumoniae: DETECTED — AB
Streptococcus pyogenes: NOT DETECTED

## 2017-10-11 LAB — GLUCOSE, CAPILLARY
GLUCOSE-CAPILLARY: 191 mg/dL — AB (ref 70–99)
GLUCOSE-CAPILLARY: 201 mg/dL — AB (ref 70–99)
GLUCOSE-CAPILLARY: 168 mg/dL — AB (ref 70–99)
Glucose-Capillary: 261 mg/dL — ABNORMAL HIGH (ref 70–99)

## 2017-10-11 LAB — BASIC METABOLIC PANEL
Anion gap: 15 (ref 5–15)
BUN: 45 mg/dL — AB (ref 8–23)
CALCIUM: 8.8 mg/dL — AB (ref 8.9–10.3)
CHLORIDE: 99 mmol/L (ref 98–111)
CO2: 31 mmol/L (ref 22–32)
CREATININE: 1.05 mg/dL — AB (ref 0.44–1.00)
GFR, EST NON AFRICAN AMERICAN: 55 mL/min — AB (ref >60)
Glucose, Bld: 187 mg/dL — ABNORMAL HIGH (ref 70–99)
Potassium: 4.2 mmol/L (ref 3.5–5.1)
SODIUM: 145 mmol/L (ref 135–145)

## 2017-10-11 LAB — COMPREHENSIVE METABOLIC PANEL
ALK PHOS: 90 U/L (ref 38–126)
ALT: 34 U/L (ref 0–44)
AST: 15 U/L (ref 15–41)
Albumin: 2.7 g/dL — ABNORMAL LOW (ref 3.5–5.0)
Anion gap: 11 (ref 5–15)
BILIRUBIN TOTAL: 0.7 mg/dL (ref 0.3–1.2)
BUN: 45 mg/dL — ABNORMAL HIGH (ref 8–23)
CALCIUM: 8.6 mg/dL — AB (ref 8.9–10.3)
CO2: 34 mmol/L — ABNORMAL HIGH (ref 22–32)
Chloride: 99 mmol/L (ref 98–111)
Creatinine, Ser: 1.13 mg/dL — ABNORMAL HIGH (ref 0.44–1.00)
GFR calc Af Amer: 58 mL/min — ABNORMAL LOW (ref >60)
GFR calc non Af Amer: 50 mL/min — ABNORMAL LOW (ref >60)
GLUCOSE: 199 mg/dL — AB (ref 70–99)
Potassium: 5.4 mmol/L — ABNORMAL HIGH (ref 3.5–5.1)
Sodium: 144 mmol/L (ref 135–145)
TOTAL PROTEIN: 6.2 g/dL — AB (ref 6.5–8.1)

## 2017-10-11 LAB — TROPONIN I: TROPONIN I: 0.08 ng/mL — AB (ref <0.03)

## 2017-10-11 LAB — STREP PNEUMONIAE URINARY ANTIGEN: Strep Pneumo Urinary Antigen: NEGATIVE

## 2017-10-11 LAB — CBC
HCT: 33.5 % — ABNORMAL LOW (ref 36.0–46.0)
Hemoglobin: 10 g/dL — ABNORMAL LOW (ref 12.0–15.0)
MCH: 28.4 pg (ref 26.0–34.0)
MCHC: 29.9 g/dL — AB (ref 30.0–36.0)
MCV: 95.2 fL (ref 78.0–100.0)
PLATELETS: 185 10*3/uL (ref 150–400)
RBC: 3.52 MIL/uL — ABNORMAL LOW (ref 3.87–5.11)
RDW: 16.4 % — ABNORMAL HIGH (ref 11.5–15.5)
WBC: 18.2 10*3/uL — ABNORMAL HIGH (ref 4.0–10.5)

## 2017-10-11 LAB — LACTIC ACID, PLASMA: LACTIC ACID, VENOUS: 1.2 mmol/L (ref 0.5–1.9)

## 2017-10-11 LAB — MRSA PCR SCREENING: MRSA by PCR: POSITIVE — AB

## 2017-10-11 LAB — HIV ANTIBODY (ROUTINE TESTING W REFLEX): HIV Screen 4th Generation wRfx: NONREACTIVE

## 2017-10-11 LAB — PROCALCITONIN: Procalcitonin: 2.75 ng/mL

## 2017-10-11 MED ORDER — INSULIN DETEMIR 100 UNIT/ML ~~LOC~~ SOLN
12.0000 [IU] | Freq: Every day | SUBCUTANEOUS | Status: DC
Start: 2017-10-11 — End: 2017-10-13
  Administered 2017-10-11 – 2017-10-12 (×2): 12 [IU] via SUBCUTANEOUS
  Filled 2017-10-11 (×2): qty 0.12

## 2017-10-11 MED ORDER — CHLORHEXIDINE GLUCONATE CLOTH 2 % EX PADS
6.0000 | MEDICATED_PAD | Freq: Every day | CUTANEOUS | Status: AC
  Administered 2017-10-12 – 2017-10-15 (×3): 6 via TOPICAL

## 2017-10-11 MED ORDER — GUAIFENESIN ER 600 MG PO TB12
1200.0000 mg | ORAL_TABLET | Freq: Two times a day (BID) | ORAL | Status: DC
  Administered 2017-10-11 – 2017-10-22 (×20): 1200 mg via ORAL
  Filled 2017-10-11 (×20): qty 2

## 2017-10-11 MED ORDER — MUPIROCIN 2 % EX OINT
1.0000 "application " | TOPICAL_OINTMENT | Freq: Two times a day (BID) | CUTANEOUS | Status: AC
  Administered 2017-10-11 – 2017-10-16 (×10): 1 via NASAL
  Filled 2017-10-11 (×2): qty 22

## 2017-10-11 MED ORDER — LORAZEPAM 2 MG/ML IJ SOLN
0.5000 mg | Freq: Three times a day (TID) | INTRAMUSCULAR | Status: DC | PRN
  Administered 2017-10-11: 0.5 mg via INTRAVENOUS
  Filled 2017-10-11 (×2): qty 1

## 2017-10-11 MED ORDER — DIGOXIN 0.25 MG/ML IJ SOLN
0.1250 mg | Freq: Once | INTRAMUSCULAR | Status: AC
  Administered 2017-10-11: 0.125 mg via INTRAVENOUS
  Filled 2017-10-11 (×2): qty 0.5

## 2017-10-11 MED ORDER — SODIUM CHLORIDE 0.9 % IV SOLN
2.0000 g | INTRAVENOUS | Status: DC
  Administered 2017-10-11 – 2017-10-22 (×12): 2 g via INTRAVENOUS
  Filled 2017-10-11 (×12): qty 2

## 2017-10-11 MED ORDER — SODIUM POLYSTYRENE SULFONATE 15 GM/60ML PO SUSP
45.0000 g | Freq: Once | ORAL | Status: AC
  Administered 2017-10-11: 45 g via ORAL
  Filled 2017-10-11: qty 180

## 2017-10-11 NOTE — Progress Notes (Signed)
Inpatient Diabetes Program Recommendations  AACE/ADA: New Consensus Statement on Inpatient Glycemic Control (2015)  Target Ranges:  Prepandial:   less than 140 mg/dL      Peak postprandial:   less than 180 mg/dL (1-2 hours)      Critically ill patients:  140 - 180 mg/dL   Lab Results  Component Value Date   GLUCAP 261 (H) 10/11/2017   HGBA1C 7.2 (H) 08/01/2017    Review of Glycemic Control  Diabetes history: DM2 Outpatient Diabetes medications: Levemir 12 units bid, Humalog 2-10 units tidwc, metformin 500 mg bid Current orders for Inpatient glycemic control: Levemir 12 units QHS, Novolog 0-15 units tidwc  On Solumedrol 60 mg Q12H.    HgbA1C indicates good glycemic control.  Inpatient Diabetes Program Recommendations:     Half home dose of Levemir to start tonight at 2200. May need HS correction while on steroids.   Thank you. Lorenda Peck, RD, LDN, CDE Inpatient Diabetes Coordinator [phone]

## 2017-10-11 NOTE — Consult Note (Signed)
[facility] for Infectious Disease    Date of Admission:  10/10/2017   Total days of antibiotics 2        Day 1 ceftriaxone               Reason for Consult: Pneumococcal bacteremia    Referring Provider: Dr. Irine Seal  Assessment: She has pneumococcal bacteremia, most likely secondary to pneumonia.  I agree with ceftriaxone pending antibiotic susceptibility results.  She needs 10 days of total antibiotic therapy.  Once she is clearly better and antibiotic susceptibility results are available she can change to an appropriate oral agent to complete therapy.  I do not feel that she needs any evaluation for possible endocarditis as this is unlikely.  Plan: 1. Continue ceftriaxone pending antibiotic susceptibility results  Principal Problem:   Pneumococcal bacteremia Active Problems:   HCAP (healthcare-associated pneumonia)   Acute on chronic respiratory failure (HCC)   Acute on chronic diastolic CHF (congestive heart failure) (HCC)   COPD (chronic obstructive pulmonary disease) (HCC)   HTN (hypertension)   AKI (acute kidney injury) (HCC)   DM (diabetes mellitus) (HCC)   Lactic acidosis   Elevated troponin   Hyperkalemia   Scheduled Meds: . arformoterol  15 mcg Nebulization BID  . atorvastatin  40 mg Oral Daily  . budesonide (PULMICORT) nebulizer solution  0.5 mg Nebulization BID  . busPIRone  10 mg Oral TID  . digoxin  0.125 mg Oral Daily  . diltiazem  180 mg Oral Daily  . enoxaparin (LOVENOX) injection  40 mg Subcutaneous Q24H  . furosemide  40 mg Intravenous BID  . guaiFENesin  1,200 mg Oral BID  . haloperidol lactate  2.5 mg Intravenous Once  . insulin aspart  0-15 Units Subcutaneous TID WC  . ipratropium-albuterol  3 mL Nebulization TID  . magnesium oxide  400 mg Oral Q12H  . methylPREDNISolone (SOLU-MEDROL) injection  60 mg Intravenous Q12H  . metoprolol succinate  25 mg Oral Daily  . mirtazapine  15 mg Oral QHS  . pantoprazole  40 mg Oral Q12H     Continuous Infusions: . cefTRIAXone (ROCEPHIN)  IV Stopped (10/11/17 0949)   PRN Meds:.acetaminophen **OR** acetaminophen, guaiFENesin-dextromethorphan, morphine, ondansetron **OR** ondansetron (ZOFRAN) IV, senna-docusate  HPI: Vanessa Alexander is a 62 y.o. female with diabetes, COPD and CHF.  She was hospitalized at [facility] from May 17-24.  She was treated for a COPD exacerbation and presumed pneumonia.  She was discharged to a skilled nursing facility on 7 days of amoxicillin clavulanate.  She returned home 2 days ago but grew progressively weak and more short of breath leading to admission yesterday.  She says that she has had some increase in cough productive of brown sputum.  She is not aware of any fever, chills or sweats.  Her chest x-ray showed stable bibasilar densities.  1 of 2 admission blood cultures has grown pneumococcus.  She tells me that she is feeling a little bit better today.   Review of Systems: Review of Systems  Constitutional: Positive for malaise/fatigue. Negative for chills, diaphoresis and fever.  Respiratory: Positive for cough, sputum production and shortness of breath. Negative for wheezing.   Cardiovascular: Negative for chest pain.  Gastrointestinal: Negative for abdominal pain, diarrhea, nausea and vomiting.  Genitourinary: Negative for dysuria.    Past Medical History:  Diagnosis Date  . CHF (congestive heart failure) ([place])   . COPD (chronic obstructive pulmonary disease) (  [place])   . Cor pulmonale (chronic) ([place])   . Diabetes mellitus without complication ([place])   . Hypertension   . Paroxysmal atrial fibrillation (HCC)   . Pulmonary hypertension (HCC)     Social History   Tobacco Use  . Smoking status: Former Smoker    Types: Cigarettes    Last attempt to quit: 06/08/2013    Years since quitting: 4.3  . Smokeless tobacco: Never Used  Substance Use Topics  . Alcohol use: Never    Frequency: Never  . Drug  use: Never    History reviewed. No pertinent family history. Allergies  Allergen Reactions  . Lisinopril Swelling  . Codeine Rash    OBJECTIVE: Blood pressure 105/63, pulse 95, temperature 97.9 F (36.6 C), temperature source Oral, resp. rate 17, height _0  (1.499 m), weight 179 lb 0.2 oz (81.2 kg), SpO2 93 %.  Physical Exam  Constitutional: She is oriented to person, place, and time.  She is thin and frail but alert and in no distress.  Neck: Neck supple.  Cardiovascular: Normal rate, regular rhythm and normal heart sounds.  No murmur heard. Distant heart sounds.  Pulmonary/Chest: Effort normal and breath sounds normal. She has no wheezes. She has no rales.  She is wearing mask oxygen.  Her lungs are clear anteriorly.  Abdominal: Soft. She exhibits no distension. There is no tenderness.  Neurological: She is alert and oriented to person, place, and time.  Skin: No rash noted.  Psychiatric: She has a normal mood and affect.    Lab Results Lab Results  Component Value Date   WBC 18.2 (H) 10/11/2017   HGB 10.0 (L) 10/11/2017   HCT 33.5 (L) 10/11/2017   MCV 95.2 10/11/2017   PLT 185 10/11/2017    Lab Results  Component Value Date   CREATININE 1.05 (H) 10/11/2017   BUN 45 (H) 10/11/2017   NA 145 10/11/2017   K 4.2 10/11/2017   CL 99 10/11/2017   CO2 31 10/11/2017    Lab Results  Component Value Date   ALT 34 10/11/2017   AST 15 10/11/2017   ALKPHOS 90 10/11/2017   BILITOT 0.7 10/11/2017     Microbiology: Recent Results (from the past 240 hour(s))  Blood culture (routine x 2)     Status: None (Preliminary result)   Collection Time: 10/10/17 12:08 PM  Result Value Ref Range Status   Specimen Description   Final    BLOOD RIGHT ARM Performed at [facility], [place] [address] [place], [place] [address]    Special Requests   Final    BOTTLES DRAWN AEROBIC AND ANAEROBIC Blood Culture adequate volume Performed at [facility]  [address], [place], [place] [address]    Culture   Final    NO GROWTH < 24 HOURS Performed at [facility] [address] [place], [place] [address]    Report Status PENDING  Incomplete  Blood culture (routine x 2)     Status: None (Preliminary result)   Collection Time: 10/10/17 12:13 PM  Result Value Ref Range Status   Specimen Description   Final    BLOOD LEFT ARM Performed at [facility], [place] [address] [place], [place] [address]    Special Requests   Final    BOTTLES DRAWN AEROBIC AND ANAEROBIC Blood Culture adequate volume Performed at [facility] [address] [place],  [address]    Culture  Setup Time  Final    AEROBIC BOTTLE ONLY GRAM POSITIVE COCCI CRITICAL RESULT CALLED TO, READ BACK BY AND VERIFIED WITH: Colin Rhein [facility] 10/11/17 0254 JDW Performed at [facility], [address] [place], [place] [address]    Culture GRAM POSITIVE COCCI  Final   Report Status PENDING  Incomplete  Blood Culture ID Panel (Reflexed)     Status: Abnormal   Collection Time: 10/10/17 12:13 PM  Result Value Ref Range Status   Enterococcus species NOT DETECTED NOT DETECTED Final   Listeria monocytogenes NOT DETECTED NOT DETECTED Final   Staphylococcus species NOT DETECTED NOT DETECTED Final   Staphylococcus aureus NOT DETECTED NOT DETECTED Final   Streptococcus species DETECTED (A) NOT DETECTED Final    Comment: CRITICAL RESULT CALLED TO, READ BACK BY AND VERIFIED WITH: M BELL PHARMD 10/11/17 4175 JDW    Streptococcus agalactiae NOT DETECTED NOT DETECTED Final   Streptococcus pneumoniae DETECTED (A) NOT DETECTED Final    Comment: CRITICAL RESULT CALLED TO, READ BACK BY AND VERIFIED WITH: Jerilynn Mages BELL PHARMD 10/11/17 3010 JDW    Streptococcus pyogenes NOT DETECTED NOT DETECTED Final   Acinetobacter baumannii NOT DETECTED NOT DETECTED Final   Enterobacteriaceae species NOT DETECTED NOT DETECTED Final   Enterobacter cloacae complex NOT DETECTED NOT  DETECTED Final   Escherichia coli NOT DETECTED NOT DETECTED Final   Klebsiella oxytoca NOT DETECTED NOT DETECTED Final   Klebsiella pneumoniae NOT DETECTED NOT DETECTED Final   Proteus species NOT DETECTED NOT DETECTED Final   Serratia marcescens NOT DETECTED NOT DETECTED Final   Haemophilus influenzae NOT DETECTED NOT DETECTED Final   Neisseria meningitidis NOT DETECTED NOT DETECTED Final   Pseudomonas aeruginosa NOT DETECTED NOT DETECTED Final   Candida albicans NOT DETECTED NOT DETECTED Final   Candida glabrata NOT DETECTED NOT DETECTED Final   Candida krusei NOT DETECTED NOT DETECTED Final   Candida parapsilosis NOT DETECTED NOT DETECTED Final   Candida tropicalis NOT DETECTED NOT DETECTED Final    Comment: Performed at [facility], [place] [address] [place], [place] [address]    Michel Bickers, [facility] [phone] pager   [phone] cell 10/11/2017, 1:00 PM

## 2017-10-11 NOTE — Progress Notes (Signed)
PROGRESS NOTE    Vanessa Alexander  EUM:[id number] DOB: 1955-04-22 DOA: 10/10/2017 PCP: Townsend Roger, MD    Brief Narrative:  Vanessa Alexander is a 62 y.o. 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  Patient open BiPAP during my interview, however wake up and answer questions.  Patient was hospitalized 1 month ago at [facility] due to COPD exacerbation and pneumonia and was discharged to SNF for short-term rehab.  She just left the facility 1 day ago.  Patient reported that she was having difficulty breathing and worsening of her baseline shortness of breath for the past week or so and apparently this was not addressed.  Patient was unable to walk around her house with her baseline 6 L nasal cannula, developing cough and generalized fatigue.  Patient denies chest pain, palpitations and dizziness.  ED Course: EMS found patient with oxygen saturation in the low 80s, gave her steroids and placed on BiPAP.  Labs remarkable for WBC of 22, chest x-ray with increasing vascular congestion, and bibasilar opacities.  Elevated creatinine at 1.7 and mild elevated lactic acid at 2.9.  Patient was given IV fluids and antibiotics by EDP.      Assessment & Plan:   Principal Problem:   Acute on chronic respiratory failure (HCC) Active Problems:   Bacteremia due to Gram-positive bacteria   HCAP (healthcare-associated pneumonia)   Acute on chronic diastolic CHF (congestive heart failure) (HCC)   COPD (chronic obstructive pulmonary disease) (HCC)   HTN (hypertension)   AKI (acute kidney injury) (HCC)   DM (diabetes mellitus) (HCC)   Lactic acidosis   Elevated troponin   Hyperkalemia  #1 acute on chronic respiratory failure with hypoxia and hypercarbia Likely multifactorial secondary to H CAP, COPD, acute on chronic diastolic CHF  exacerbation.  Patient noted on admission to have lower extremity edema, with hypoxia and hypercarbia.  Chest x-ray which was done showed bibasilar infiltrates left greater than right.  Patient placed on the BiPAP and had been on the BiPAP overnight.  Patient with some clinical improvement.  Chest x-ray on admission was concerning for volume overload BNP was elevated at 726 with mildly elevated troponin.  Patient placed on IV Lasix, empiric IV vancomycin IV cefepime.  Blood cultures positive for strep pneumococcus.  Patient with a urine output of 750 cc overnight.  Currently on BiPAP.  Trial of BiPAP.  Discontinue IV vancomycin IV cefepime and placed on IV Rocephin.  Continue empiric IV Lasix.  Continue Brovana, Pulmicort, Mucinex, scheduled duo nebs IV Solu-Medrol, PPI.  Follow.  If worsening with no improvement will consult with critical care medicine for further evaluation and management.  2.  Gram-positive bacteremia secondary to Streptococcus pneumonia Preliminary blood cultures with gram-positive bacteremia with BC ID showing Streptococcus pneumoniae.  Patient presented with acute on chronic respiratory failure with concerns for healthcare associated pneumonia.  Discontinue IV vancomycin and IV cefepime.  Placed on IV Rocephin.  Likely repeat blood cultures in the next 24 to 48 hours.  Consult with ID for further evaluation and management.  3.  Acute on chronic diastolic heart failure Patient presented with worsening shortness of breath, chest x-ray worrisome for fluid overload, BNP elevated at 726 with mildly elevated troponin.  Patient also noted to have some lower extremity edema.  Patient with recent 2D echo done 07/25/2017 with normal systolic function,  mild diastolic dysfunction, moderate LVH, D-shaped septum, severe dilated right atrium, increased right atrial pressure with a PA peak pressure of 58 mmHg.  Patient placed on IV Lasix with urine output of 750 cc overnight.  Continue Lasix 40 mg IV  every 12 hours.  Strict I's and O's.  Daily weights.  4. HCAP Patient noted to have recently been discharged from [facility] due to COPD and pneumonia was at a skilled nursing facility.  Patient presented with cough, leukocytosis.  Blood cultures positive for Streptococcus pneumonia.  Check a urine Legionella antigen.  Check a urine pneumococcus antigen.  MRSA PCR pending.  Repeat procalcitonin level and lactic acid level which were elevated on admission.  Continue supportive treatment.  Discontinue IV vancomycin IV cefepime and transition to IV Rocephin as blood cultures positive for Streptococcus pneumonia.  BiPAP.  Supportive care.  5.  COPD No wheezing noted on examination.  Patient however with fair air movement and had presented with worsening shortness of breath.  Patient also being treated for pneumonia.  Continue current nebulizer treatments, Pulmicort, Brovana, duo nebs.  Placed on Mucinex twice daily.  Continue empiric IV antibiotics.  Follow.  6.  Acute kidney injury Likely cardiorenal.  Renal function improving with diuresis.  Continue IV Lasix.  Follow.  7.  Hyperkalemia We will give a dose of Kayexalate.  Repeat be met.  Follow.  8.  Lactic acidosis Likely secondary to problem #1.  Continue current treatment as in problem #1.  Repeat lactic acid level with improvement.  Check a procalcitonin level.  9.  Elevated troponin Likely secondary to demand ischemia from acute CHF exacerbation.  EKG with no ischemic changes noted.  Patient with recent 2D echo done and as such will not repeat.  Follow.  10.  Hypertension Stable.  Continue current regimen.  11.  Type 2 diabetes mellitus Patient currently n.p.o. and as such Lantus on hold.  Hemoglobin A1c was 7.2 on 08/01/2017.  CBG this morning was 191.  Continue sliding scale insulin.    DVT prophylaxis: Lovenox Code Status: Full Family Communication: Updated patient.  No family at bedside. Disposition Plan: Remain in stepdown  unit.  To be determined.   Consultants:   ID pending.  Procedures:   Chest x-ray 10/10/2017, 10/11/2017    Antimicrobials:   IV vancomycin 10/10/2017>>>>> 10/11/2017  IV cefepime 10/10/2017>>>>> 10/11/2017  IV Rocephin 10/11/2017   Subjective: On BiPAP.  Patient states some improvement with shortness of breath since admission.  Denies any chest pain.  No abdominal pain.  Objective: Vitals:   10/11/17 0500 10/11/17 0600 10/11/17 0715 10/11/17 0728  BP: (!) 103/58 102/81    Pulse: 66 74 71   Resp: 16 15    Temp:    (!) 97.3 F (36.3 C)  TempSrc:    Axillary  SpO2: 94% 93% 94% 95%  Weight: 81.2 kg (179 lb 0.2 oz)     Height:        Intake/Output Summary (Last 24 hours) at 10/11/2017 0938 Last data filed at 10/11/2017 0600 Gross per 24 hour  Intake 766.77 ml  Output 750 ml  Net 16.77 ml   Filed Weights   10/10/17 1126 10/10/17 1529 10/11/17 0500  Weight: 77.1 kg (170 lb) 79.9 kg (176 lb 2.4 oz) 81.2 kg (179 lb 0.2 oz)    Examination:  General exam: On BiPAP.  Respiratory system: Some scattered coarse breath sounds.  Poor to fair air movement.  Some bibasilar crackles.  On BiPAP. Cardiovascular system:  S1 & S2 heard, RRR. No JVD, murmurs, rubs, gallops or clicks.  1-2+ bilateral lower extremity edema. Gastrointestinal system: Abdomen is soft, nontender, nondistended, positive bowel sounds.  No rebound.  No guarding.  Central nervous system: Alert and oriented. No focal neurological deficits. Extremities: 1-2+ bilateral lower extremity edema.  Symmetric 5 x 5 power. Skin: No rashes, lesions or ulcers Psychiatry: Judgement and insight appear normal. Mood & affect appropriate.     Data Reviewed: I have personally reviewed following labs and imaging studies  CBC: Recent Labs  Lab 10/10/17 1134 10/11/17 0311  WBC 22.4* 18.2*  NEUTROABS 20.6*  --   HGB 10.8* 10.0*  HCT 36.3 33.5*  MCV 95.3 95.2  PLT 181 536   Basic Metabolic Panel: Recent Labs  Lab  10/10/17 1134 10/11/17 0311  NA 141 144  K 5.2* 5.4*  CL 95* 99  CO2 31 34*  GLUCOSE 247* 199*  BUN 38* 45*  CREATININE 1.24* 1.13*  CALCIUM 8.7* 8.6*   GFR: Estimated Creatinine Clearance: 46.4 mL/min (A) (by C-G formula based on SCr of 1.13 mg/dL (H)). Liver Function Tests: Recent Labs  Lab 10/10/17 1134 10/11/17 0311  AST 27 15  ALT 38 34  ALKPHOS 107 90  BILITOT 1.1 0.7  PROT 6.8 6.2*  ALBUMIN 3.1* 2.7*   No results for input(s): LIPASE, AMYLASE in the last 168 hours. No results for input(s): AMMONIA in the last 168 hours. Coagulation Profile: No results for input(s): INR, PROTIME in the last 168 hours. Cardiac Enzymes: Recent Labs  Lab 10/10/17 1134 10/10/17 1546 10/10/17 2225 10/11/17 0311  TROPONINI 0.10* 0.10* 0.10* 0.08*   BNP (last 3 results) No results for input(s): PROBNP in the last 8760 hours. HbA1C: No results for input(s): HGBA1C in the last 72 hours. CBG: Recent Labs  Lab 10/10/17 1756 10/10/17 2145 10/11/17 0726  GLUCAP 284* 228* 191*   Lipid Profile: No results for input(s): CHOL, HDL, LDLCALC, TRIG, CHOLHDL, LDLDIRECT in the last 72 hours. Thyroid Function Tests: No results for input(s): TSH, T4TOTAL, FREET4, T3FREE, THYROIDAB in the last 72 hours. Anemia Panel: No results for input(s): VITAMINB12, FOLATE, FERRITIN, TIBC, IRON, RETICCTPCT in the last 72 hours. Sepsis Labs: Recent Labs  Lab 10/10/17 1254 10/10/17 1546 10/11/17 0804  PROCALCITON  --  2.25 2.75  LATICACIDVEN 2.92*  --  1.2    Recent Results (from the past 240 hour(s))  Blood culture (routine x 2)     Status: None (Preliminary result)   Collection Time: 10/10/17 12:08 PM  Result Value Ref Range Status   Specimen Description   Final    BLOOD RIGHT ARM Performed at [facility], [place] [address] [place], [place] [address]    Special Requests   Final    BOTTLES DRAWN AEROBIC AND ANAEROBIC Blood Culture adequate volume Performed at [facility] [address] [place], [place] [address]    Culture   Final    NO GROWTH < 24 HOURS Performed at [facility] [address], [place], [place] [address]    Report Status PENDING  Incomplete  Blood culture (routine x 2)     Status: None (Preliminary result)   Collection Time: 10/10/17 12:13 PM  Result Value Ref Range Status   Specimen Description   Final    BLOOD LEFT ARM Performed at [facility], [place] [address], [place], [place] [address]    Special Requests   Final    BOTTLES  DRAWN AEROBIC AND ANAEROBIC Blood Culture adequate volume Performed at [facility] [address], [place], [place] [address]    Culture  Setup Time   Final    AEROBIC BOTTLE ONLY GRAM POSITIVE COCCI CRITICAL RESULT CALLED TO, READ BACK BY AND VERIFIED WITH: Colin Rhein [facility] 10/11/17 6294 JDW Performed at [facility], [address] [place], [place] [address]    Culture GRAM POSITIVE COCCI  Final   Report Status PENDING  Incomplete  Blood Culture ID Panel (Reflexed)     Status: Abnormal   Collection Time: 10/10/17 12:13 PM  Result Value Ref Range Status   Enterococcus species NOT DETECTED NOT DETECTED Final   Listeria monocytogenes NOT DETECTED NOT DETECTED Final   Staphylococcus species NOT DETECTED NOT DETECTED Final   Staphylococcus aureus NOT DETECTED NOT DETECTED Final   Streptococcus species DETECTED (A) NOT DETECTED Final    Comment: CRITICAL RESULT CALLED TO, READ BACK BY AND VERIFIED WITH: M BELL PHARMD 10/11/17 5035 JDW    Streptococcus agalactiae NOT DETECTED NOT DETECTED Final   Streptococcus pneumoniae DETECTED (A) NOT DETECTED Final    Comment: CRITICAL RESULT CALLED TO, READ BACK BY AND VERIFIED WITH: Jerilynn Mages BELL PHARMD 10/11/17 4656 JDW    Streptococcus pyogenes NOT DETECTED NOT DETECTED Final   Acinetobacter baumannii NOT DETECTED NOT DETECTED Final   Enterobacteriaceae species NOT DETECTED NOT DETECTED Final   Enterobacter cloacae complex  NOT DETECTED NOT DETECTED Final   Escherichia coli NOT DETECTED NOT DETECTED Final   Klebsiella oxytoca NOT DETECTED NOT DETECTED Final   Klebsiella pneumoniae NOT DETECTED NOT DETECTED Final   Proteus species NOT DETECTED NOT DETECTED Final   Serratia marcescens NOT DETECTED NOT DETECTED Final   Haemophilus influenzae NOT DETECTED NOT DETECTED Final   Neisseria meningitidis NOT DETECTED NOT DETECTED Final   Pseudomonas aeruginosa NOT DETECTED NOT DETECTED Final   Candida albicans NOT DETECTED NOT DETECTED Final   Candida glabrata NOT DETECTED NOT DETECTED Final   Candida krusei NOT DETECTED NOT DETECTED Final   Candida parapsilosis NOT DETECTED NOT DETECTED Final   Candida tropicalis NOT DETECTED NOT DETECTED Final    Comment: Performed at [facility], [place] [address], [place], [place] [address]         Radiology Studies: Dg Chest Port 1 View  Result Date: 10/11/2017 CLINICAL DATA:  Shortness of breath EXAM: PORTABLE CHEST 1 VIEW COMPARISON:  10/10/2017 FINDINGS: Cardiomegaly again noted. Bibasilar opacities, LEFT-greater-than-RIGHT, are unchanged. No new pulmonary opacities are identified. No pneumothorax or definite pleural effusion. IMPRESSION: Unchanged appearance of the chest with cardiomegaly and bibasilar opacities, LEFT-greater-than-RIGHT. Electronically Signed   By: Margarette Canada M.D.   On: 10/11/2017 05:45   Dg Chest Portable 1 View  Result Date: 10/10/2017 CLINICAL DATA:  Increased shortness of breath. Difficulty breathing. EXAM: PORTABLE CHEST 1 VIEW COMPARISON:  Sep 03, 2017 FINDINGS: No pneumothorax. Mild bibasilar opacities are stable on the right and more pronounced on the left in the interval. No overt edema. No change in the cardiomediastinal silhouette. IMPRESSION: Bibasilar opacities again identified, stable on the right and more pronounced on the left. No other interval change. Electronically Signed   By: Dorise Bullion III M.D   On: 10/10/2017 12:02         Scheduled Meds: . arformoterol  15 mcg Nebulization BID  . atorvastatin  40 mg Oral Daily  . budesonide (PULMICORT) nebulizer solution  0.5 mg Nebulization BID  . busPIRone  10 mg Oral TID  . digoxin  0.125 mg Oral Daily  . diltiazem  180 mg Oral Daily  . enoxaparin (LOVENOX) injection  40 mg Subcutaneous Q24H  . furosemide  40 mg Intravenous BID  . guaiFENesin  1,200 mg Oral BID  . haloperidol lactate  2.5 mg Intravenous Once  . insulin aspart  0-15 Units Subcutaneous TID WC  . ipratropium-albuterol  3 mL Nebulization TID  . magnesium oxide  400 mg Oral Q12H  . methylPREDNISolone (SOLU-MEDROL) injection  60 mg Intravenous Q12H  . metoprolol succinate  25 mg Oral Daily  . mirtazapine  15 mg Oral QHS  . pantoprazole  40 mg Oral Q12H   Continuous Infusions: . cefTRIAXone (ROCEPHIN)  IV 2 g (10/11/17 0916)  . [START ON 10/12/2017] vancomycin       LOS: 1 day    Time spent: 40 minutes    Irine Seal, MD [facility] Pager [phone]  If 7PM-7AM, please contact night-coverage [email or web address] Password [facility] 10/11/2017, 9:38 AM

## 2017-10-11 NOTE — Progress Notes (Signed)
PHARMACY - PHYSICIAN COMMUNICATION CRITICAL VALUE ALERT - BLOOD CULTURE IDENTIFICATION (BCID)  Vanessa Alexander is an 62 y.o. female who presented to [facility] on 10/10/2017 with a chief complaint of Shortness of breath  Assessment:  Recent hospitalization at [facility] for AECOPD and PNA.  Pneumococcus bacteremia per BCID from PNA  Name of physician (or Provider) Contacted: Grandville Silos  Current antibiotics: vancomycin and cefepime  Changes to prescribed antibiotics recommended:  Per MD, OK to change cefepime to ceftriaxone but wants to continue vancomycin at this time.   Recommendations declined by provider - infection may be polymicrobial  Results for orders placed or performed during the hospital encounter of 10/10/17  Blood Culture ID Panel (Reflexed) (Collected: 10/10/2017 12:13 PM)  Result Value Ref Range   Enterococcus species NOT DETECTED NOT DETECTED   Listeria monocytogenes NOT DETECTED NOT DETECTED   Staphylococcus species NOT DETECTED NOT DETECTED   Staphylococcus aureus NOT DETECTED NOT DETECTED   Streptococcus species DETECTED (A) NOT DETECTED   Streptococcus agalactiae NOT DETECTED NOT DETECTED   Streptococcus pneumoniae DETECTED (A) NOT DETECTED   Streptococcus pyogenes NOT DETECTED NOT DETECTED   Acinetobacter baumannii NOT DETECTED NOT DETECTED   Enterobacteriaceae species NOT DETECTED NOT DETECTED   Enterobacter cloacae complex NOT DETECTED NOT DETECTED   Escherichia coli NOT DETECTED NOT DETECTED   Klebsiella oxytoca NOT DETECTED NOT DETECTED   Klebsiella pneumoniae NOT DETECTED NOT DETECTED   Proteus species NOT DETECTED NOT DETECTED   Serratia marcescens NOT DETECTED NOT DETECTED   Haemophilus influenzae NOT DETECTED NOT DETECTED   Neisseria meningitidis NOT DETECTED NOT DETECTED   Pseudomonas aeruginosa NOT DETECTED NOT DETECTED   Candida albicans NOT DETECTED NOT DETECTED   Candida glabrata NOT DETECTED NOT DETECTED   Candida krusei NOT DETECTED NOT DETECTED   Candida parapsilosis NOT DETECTED NOT DETECTED   Candida tropicalis NOT DETECTED NOT DETECTED    Doreene Eland, PharmD, BCPS.   10/11/2017 7:10 AM

## 2017-10-12 ENCOUNTER — Inpatient Hospital Stay (HOSPITAL_COMMUNITY): Payer: Medicare Other

## 2017-10-12 DIAGNOSIS — J9621 Acute and chronic respiratory failure with hypoxia: Secondary | ICD-10-CM

## 2017-10-12 DIAGNOSIS — J9622 Acute and chronic respiratory failure with hypercapnia: Secondary | ICD-10-CM

## 2017-10-12 DIAGNOSIS — E870 Hyperosmolality and hypernatremia: Secondary | ICD-10-CM

## 2017-10-12 LAB — PROCALCITONIN: PROCALCITONIN: 1.81 ng/mL

## 2017-10-12 LAB — CBC WITH DIFFERENTIAL/PLATELET
BASOS ABS: 0 10*3/uL (ref 0.0–0.1)
BASOS PCT: 0 %
Eosinophils Absolute: 0 10*3/uL (ref 0.0–0.7)
Eosinophils Relative: 0 %
HEMATOCRIT: 32.3 % — AB (ref 36.0–46.0)
HEMOGLOBIN: 9.8 g/dL — AB (ref 12.0–15.0)
LYMPHS PCT: 4 %
Lymphs Abs: 0.5 10*3/uL — ABNORMAL LOW (ref 0.7–4.0)
MCH: 28.8 pg (ref 26.0–34.0)
MCHC: 30.3 g/dL (ref 30.0–36.0)
MCV: 95 fL (ref 78.0–100.0)
MONO ABS: 0.2 10*3/uL (ref 0.1–1.0)
MONOS PCT: 2 %
NEUTROS ABS: 12.3 10*3/uL — AB (ref 1.7–7.7)
NEUTROS PCT: 94 %
Platelets: 200 10*3/uL (ref 150–400)
RBC: 3.4 MIL/uL — ABNORMAL LOW (ref 3.87–5.11)
RDW: 16.5 % — AB (ref 11.5–15.5)
WBC: 13.1 10*3/uL — ABNORMAL HIGH (ref 4.0–10.5)

## 2017-10-12 LAB — GLUCOSE, CAPILLARY
GLUCOSE-CAPILLARY: 307 mg/dL — AB (ref 70–99)
GLUCOSE-CAPILLARY: 314 mg/dL — AB (ref 70–99)
Glucose-Capillary: 242 mg/dL — ABNORMAL HIGH (ref 70–99)
Glucose-Capillary: 150 mg/dL — ABNORMAL HIGH (ref 70–99)

## 2017-10-12 LAB — BASIC METABOLIC PANEL
ANION GAP: 11 (ref 5–15)
BUN: 47 mg/dL — ABNORMAL HIGH (ref 8–23)
CHLORIDE: 99 mmol/L (ref 98–111)
CO2: 37 mmol/L — ABNORMAL HIGH (ref 22–32)
Calcium: 8.5 mg/dL — ABNORMAL LOW (ref 8.9–10.3)
Creatinine, Ser: 1.11 mg/dL — ABNORMAL HIGH (ref 0.44–1.00)
GFR calc non Af Amer: 51 mL/min — ABNORMAL LOW (ref >60)
GFR, EST AFRICAN AMERICAN: 59 mL/min — AB (ref >60)
GLUCOSE: 212 mg/dL — AB (ref 70–99)
Potassium: 3.8 mmol/L (ref 3.5–5.1)
Sodium: 147 mmol/L — ABNORMAL HIGH (ref 135–145)

## 2017-10-12 MED ORDER — ORAL CARE MOUTH RINSE
15.0000 mL | Freq: Two times a day (BID) | OROMUCOSAL | Status: DC
  Administered 2017-10-12 – 2017-10-21 (×14): 15 mL via OROMUCOSAL

## 2017-10-12 MED ORDER — CHLORHEXIDINE GLUCONATE 0.12 % MT SOLN
15.0000 mL | Freq: Two times a day (BID) | OROMUCOSAL | Status: DC
  Administered 2017-10-12 – 2017-10-22 (×17): 15 mL via OROMUCOSAL
  Filled 2017-10-12 (×15): qty 15

## 2017-10-12 MED ORDER — METOPROLOL TARTRATE 5 MG/5ML IV SOLN: 5.0000 mg | INTRAVENOUS | Status: DC | PRN

## 2017-10-12 NOTE — Progress Notes (Signed)
PROGRESS NOTE    Vanessa Alexander  XBM:[id number] DOB: Dec 12, 1955 DOA: 10/10/2017 PCP: Townsend Roger, MD    Brief Narrative:  Vanessa Alexander is a 62 y.o. 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  Patient open BiPAP during my interview, however wake up and answer questions.  Patient was hospitalized 1 month ago at [facility] due to COPD exacerbation and pneumonia and was discharged to SNF for short-term rehab.  She just left the facility 1 day ago.  Patient reported that she was having difficulty breathing and worsening of her baseline shortness of breath for the past week or so and apparently this was not addressed.  Patient was unable to walk around her house with her baseline 6 L nasal cannula, developing cough and generalized fatigue.  Patient denies chest pain, palpitations and dizziness.  ED Course: EMS found patient with oxygen saturation in the low 80s, gave her steroids and placed on BiPAP.  Labs remarkable for WBC of 22, chest x-ray with increasing vascular congestion, and bibasilar opacities.  Elevated creatinine at 1.7 and mild elevated lactic acid at 2.9.  Patient was given IV fluids and antibiotics by EDP.      Assessment & Plan:   Principal Problem:   Pneumococcal bacteremia Active Problems:   Acute on chronic respiratory failure (HCC)   HCAP (healthcare-associated pneumonia)   Acute on chronic diastolic CHF (congestive heart failure) (HCC)   COPD (chronic obstructive pulmonary disease) (HCC)   HTN (hypertension)   AKI (acute kidney injury) (HCC)   DM (diabetes mellitus) (HCC)   Lactic acidosis   Elevated troponin   Hyperkalemia  #1 acute on chronic respiratory failure with hypoxia and hypercarbia Likely multifactorial secondary to HCAP, acute COPD exacerbation( on 6 L nasal cannula baseline),  acute on chronic diastolic CHF exacerbation.  Patient noted on admission to have lower extremity edema, with hypoxia and hypercarbia.  Chest x-ray which was done showed bibasilar infiltrates left greater than right.  Patient placed on the BiPAP and had been on the BiPAP overnight.  Patient with some clinical improvement.  Patient was transitioned to nonrebreather this morning however got significantly short of breath, tachypneic and tachycardic.   Chest x-ray on admission was concerning for volume overload BNP was elevated at 726 with mildly elevated troponin.  Patient placed on IV Lasix, empiric IV vancomycin IV cefepime.  Blood cultures positive for strep pneumococcus.  Patient with a urine output not accurately recorded.  Currently off BiPAP.  IV vancomycin and IV cefepime have been transitioned to IV Rocephin.  Placed on high flow nasal cannula and keep sats 88 to 93%.  Repeat chest x-ray. Continue Brovana, Pulmicort, Mucinex, scheduled duo nebs, IV Solu-Medrol, PPI.  Transition from IV Lasix to oral Lasix.  Patient with worsening symptoms shortness of breath on nonrebreather this morning.  Consult with PCCM for further evaluation and management.  Follow.   2.  Gram-positive bacteremia secondary to Streptococcus pneumonia Preliminary blood cultures with gram-positive bacteremia with BCID showing Streptococcus pneumoniae.  Patient presented with acute on chronic respiratory failure with concerns for healthcare associated pneumonia.  Discontinued IV vancomycin and IV cefepime.  Placed on IV Rocephin.  Likely repeat blood cultures in the next 24 to 48 hours.  Patient has been seen in consultation by ID who recommend continuing IV Rocephin while susceptibilities are pending and recommending  a total of 10 days of antibiotic therapy at this time.  Appreciate ID input and recommendations.    3.  Acute on chronic diastolic heart failure Patient presented with worsening shortness of breath, chest x-ray worrisome  for fluid overload, BNP elevated at 726 with mildly elevated troponin.  Patient also noted to have some lower extremity edema.  Patient with recent 2D echo done 07/25/2017 with normal systolic function, mild diastolic dysfunction, moderate LVH, D-shaped septum, severe dilated right atrium, increased right atrial pressure with a PA peak pressure of 58 mmHg.  Patient placed on IV Lasix with urine output not correctly recorded.  Current weight of 172 pounds from 179 pounds on 10/11/2017 from 176 pounds on 10/10/2017.  Change IV Lasix to oral Lasix 40 mg p.o. twice daily and follow renal function as well as sodium levels closely.  Strict I's and O's.  Daily weights.    4. HCAP Patient noted to have recently been discharged from [facility] due to COPD and pneumonia was at a skilled nursing facility.  Patient presented with cough, leukocytosis.  Blood cultures positive for Streptococcus pneumonia.  Urine Legionella antigen positive.  Urine pneumococcus antigen negative.  Check a urine Legionella antigen.  Check a urine pneumococcus antigen.  MRSA PCR is positive.  Repeat procalcitonin level and lactic acid level which were elevated on admission are trending down..  Continue supportive treatment.  Discontinued IV vancomycin and IV cefepime and transitioned to IV Rocephin as blood cultures positive for Streptococcus pneumonia.  BiPAP.  Supportive care.  5.  COPD No wheezing noted on examination.  Patient however with poor-fair air movement and had presented with worsening shortness of breath.  Patient also being treated for pneumonia.  Continue current nebulizer treatments, Pulmicort, Brovana, duo nebs, Mucinex, empiric IV Rocephin.  Repeat chest x-ray as patient with some worsening hypoxia this morning.  Keep sats between 88 to 93%.  6.  Acute kidney injury Likely cardiorenal.  Renal function improving with diuresis.  Discontinue IV Lasix and transition to home dose oral Lasix.    7.  Hyperkalemia Status post  Kayexalate.  Hyperkalemia resolved.  Follow.    8.  Lactic acidosis Likely secondary to problem #1.  Continue current treatment as in problem #1.  Repeat lactic acid level with improvement.  Procalcitonin level trending down.    9.  Elevated troponin Likely secondary to demand ischemia from acute CHF exacerbation.  EKG with no ischemic changes noted.  Patient with recent 2D echo done and as such will not repeat.  Follow.  10.  Hypertension Stable.  Continue current regimen.  11.  Type 2 diabetes mellitus Patient on clear liquids.  Hemoglobin A1c was 7.2 on 08/01/2017.  CBGs morning is 242.  Resumed half home dose Levemir at 12 units daily.  Continue sliding scale insulin for now.      DVT prophylaxis: Lovenox Code Status: Full Family Communication: Updated patient.  No family at bedside. Disposition Plan: Remain in stepdown unit.  To be determined.   Consultants:   ID: Dr. Megan Salon 10/11/2017  PCCM pending  Procedures:   Chest x-ray 10/10/2017, 10/11/2017    Antimicrobials:   IV vancomycin 10/10/2017>>>>> 10/11/2017  IV cefepime 10/10/2017>>>>> 10/11/2017  IV Rocephin 10/11/2017   Subjective: On BiPAP at night and transition to nonrebreather however patient noted to desats, complained of worsening shortness of breath, got tachycardic and tachypneic.  RN at bedside.  Patient somewhat drowsy however easily arousable and answering questions appropriately.  Patient stated she felt  better a little early on this morning however not feeling as well now.  Objective: Vitals:   10/12/17 0735 10/12/17 0741 10/12/17 0748 10/12/17 0800  BP:    112/88  Pulse:    93  Resp:    15  Temp:    (!) 97.2 F (36.2 C)  TempSrc:    Axillary  SpO2: 96% 94% 90% 98%  Weight:      Height:        Intake/Output Summary (Last 24 hours) at 10/12/2017 0918 Last data filed at 10/12/2017 0800 Gross per 24 hour  Intake 240 ml  Output -  Net 240 ml   Filed Weights   10/10/17 1529 10/11/17 0500  10/12/17 0400  Weight: 79.9 kg (176 lb 2.4 oz) 81.2 kg (179 lb 0.2 oz) 78.3 kg (172 lb 9.9 oz)    Examination:  General exam: On NRB.  Somewhat drowsy.  Respiratory system: Some scattered coarse breath sounds in the bases.  Poor to fair air movement.  On nonrebreather with sats in the mid to low 80s. Cardiovascular system: Tachycardic.  No JVD.  No murmurs rubs or gallops.  No lower extremity edema.  Gastrointestinal system: Abdomen is soft, nontender, nondistended, positive bowel sounds.  No rebound.  No guarding.  Central nervous system: Alert and oriented. No focal neurological deficits. Extremities: Symmetric 5 x 5 power. Skin: No rashes, lesions or ulcers Psychiatry: Judgement and insight appear normal. Mood & affect appropriate.     Data Reviewed: I have personally reviewed following labs and imaging studies  CBC: Recent Labs  Lab 10/10/17 1134 10/11/17 0311 10/12/17 0256  WBC 22.4* 18.2* 13.1*  NEUTROABS 20.6*  --  12.3*  HGB 10.8* 10.0* 9.8*  HCT 36.3 33.5* 32.3*  MCV 95.3 95.2 95.0  PLT 181 185 935   Basic Metabolic Panel: Recent Labs  Lab 10/10/17 1134 10/11/17 0311 10/11/17 1136 10/12/17 0256  NA 141 144 145 147*  K 5.2* 5.4* 4.2 3.8  CL 95* 99 99 99  CO2 31 34* 31 37*  GLUCOSE 247* 199* 187* 212*  BUN 38* 45* 45* 47*  CREATININE 1.24* 1.13* 1.05* 1.11*  CALCIUM 8.7* 8.6* 8.8* 8.5*   GFR: Estimated Creatinine Clearance: 46.2 mL/min (A) (by C-G formula based on SCr of 1.11 mg/dL (H)). Liver Function Tests: Recent Labs  Lab 10/10/17 1134 10/11/17 0311  AST 27 15  ALT 38 34  ALKPHOS 107 90  BILITOT 1.1 0.7  PROT 6.8 6.2*  ALBUMIN 3.1* 2.7*   No results for input(s): LIPASE, AMYLASE in the last 168 hours. No results for input(s): AMMONIA in the last 168 hours. Coagulation Profile: No results for input(s): INR, PROTIME in the last 168 hours. Cardiac Enzymes: Recent Labs  Lab 10/10/17 1134 10/10/17 1546 10/10/17 2225 10/11/17 0311    TROPONINI 0.10* 0.10* 0.10* 0.08*   BNP (last 3 results) No results for input(s): PROBNP in the last 8760 hours. HbA1C: No results for input(s): HGBA1C in the last 72 hours. CBG: Recent Labs  Lab 10/11/17 0726 10/11/17 1109 10/11/17 1700 10/11/17 2138 10/12/17 0752  GLUCAP 191* 201* 261* 168* 242*   Lipid Profile: No results for input(s): CHOL, HDL, LDLCALC, TRIG, CHOLHDL, LDLDIRECT in the last 72 hours. Thyroid Function Tests: No results for input(s): TSH, T4TOTAL, FREET4, T3FREE, THYROIDAB in the last 72 hours. Anemia Panel: No results for input(s): VITAMINB12, FOLATE, FERRITIN, TIBC, IRON, RETICCTPCT in the last 72 hours. Sepsis Labs: Recent Labs  Lab 10/10/17 1254 10/10/17 1546 10/11/17 7017  10/12/17 0256  PROCALCITON  --  2.25 2.75 1.81  LATICACIDVEN 2.92*  --  1.2  --     Recent Results (from the past 240 hour(s))  Blood culture (routine x 2)     Status: None (Preliminary result)   Collection Time: 10/10/17 12:08 PM  Result Value Ref Range Status   Specimen Description   Final    BLOOD RIGHT ARM Performed at [facility] [address], [place], [place] [address]    Special Requests   Final    BOTTLES DRAWN AEROBIC AND ANAEROBIC Blood Culture adequate volume Performed at [facility] [address] [place], [place] [address]    Culture   Final    NO GROWTH 2 DAYS Performed at [facility] [address] [place], [place] [address]    Report Status PENDING  Incomplete  Blood culture (routine x 2)     Status: Abnormal (Preliminary result)   Collection Time: 10/10/17 12:13 PM  Result Value Ref Range Status   Specimen Description   Final    BLOOD LEFT ARM Performed at [facility], [place] [address] [place], [place] [address]    Special Requests   Final    BOTTLES DRAWN AEROBIC AND ANAEROBIC Blood Culture adequate volume Performed at [facility] [address], [place], [place] [address]     Culture  Setup Time   Final    AEROBIC BOTTLE ONLY GRAM POSITIVE COCCI CRITICAL RESULT CALLED TO, READ BACK BY AND VERIFIED WITH: Colin Rhein [facility] 10/11/17 1448 JDW    Culture (A)  Final    STREPTOCOCCUS PNEUMONIAE SUSCEPTIBILITIES TO FOLLOW Performed at [facility], [address], [place], [place] [address]    Report Status PENDING  Incomplete  Blood Culture ID Panel (Reflexed)     Status: Abnormal   Collection Time: 10/10/17 12:13 PM  Result Value Ref Range Status   Enterococcus species NOT DETECTED NOT DETECTED Final   Listeria monocytogenes NOT DETECTED NOT DETECTED Final   Staphylococcus species NOT DETECTED NOT DETECTED Final   Staphylococcus aureus NOT DETECTED NOT DETECTED Final   Streptococcus species DETECTED (A) NOT DETECTED Final    Comment: CRITICAL RESULT CALLED TO, READ BACK BY AND VERIFIED WITH: M BELL PHARMD 10/11/17 1497 JDW    Streptococcus agalactiae NOT DETECTED NOT DETECTED Final   Streptococcus pneumoniae DETECTED (A) NOT DETECTED Final    Comment: CRITICAL RESULT CALLED TO, READ BACK BY AND VERIFIED WITH: Jerilynn Mages BELL PHARMD 10/11/17 0263 JDW    Streptococcus pyogenes NOT DETECTED NOT DETECTED Final   Acinetobacter baumannii NOT DETECTED NOT DETECTED Final   Enterobacteriaceae species NOT DETECTED NOT DETECTED Final   Enterobacter cloacae complex NOT DETECTED NOT DETECTED Final   Escherichia coli NOT DETECTED NOT DETECTED Final   Klebsiella oxytoca NOT DETECTED NOT DETECTED Final   Klebsiella pneumoniae NOT DETECTED NOT DETECTED Final   Proteus species NOT DETECTED NOT DETECTED Final   Serratia marcescens NOT DETECTED NOT DETECTED Final   Haemophilus influenzae NOT DETECTED NOT DETECTED Final   Neisseria meningitidis NOT DETECTED NOT DETECTED Final   Pseudomonas aeruginosa NOT DETECTED NOT DETECTED Final   Candida albicans NOT DETECTED NOT DETECTED Final   Candida glabrata NOT DETECTED NOT DETECTED Final   Candida krusei NOT DETECTED NOT DETECTED Final    Candida parapsilosis NOT DETECTED NOT DETECTED Final   Candida tropicalis NOT DETECTED NOT DETECTED Final    Comment: Performed at  [facility], [place] [address], [place], [place] [address]  MRSA PCR Screening     Status: Abnormal   Collection Time: 10/11/17  7:54 AM  Result Value Ref Range Status   MRSA by PCR POSITIVE (A) NEGATIVE Final    Comment:        The GeneXpert MRSA Assay (FDA approved for NASAL specimens only), is one component of a comprehensive MRSA colonization surveillance program. It is not intended to diagnose MRSA infection nor to guide or monitor treatment for MRSA infections. RESULT CALLED TO, READ BACK BY AND VERIFIED WITH: DUONG,T. RN _0  ON 06.28.19 BY COHEN,K Performed at [facility], [place] [address], [place], [place] [address]          Radiology Studies: Dg Chest Port 1 View  Result Date: 10/11/2017 CLINICAL DATA:  Shortness of breath EXAM: PORTABLE CHEST 1 VIEW COMPARISON:  10/10/2017 FINDINGS: Cardiomegaly again noted. Bibasilar opacities, LEFT-greater-than-RIGHT, are unchanged. No new pulmonary opacities are identified. No pneumothorax or definite pleural effusion. IMPRESSION: Unchanged appearance of the chest with cardiomegaly and bibasilar opacities, LEFT-greater-than-RIGHT. Electronically Signed   By: Margarette Canada M.D.   On: 10/11/2017 05:45   Dg Chest Portable 1 View  Result Date: 10/10/2017 CLINICAL DATA:  Increased shortness of breath. Difficulty breathing. EXAM: PORTABLE CHEST 1 VIEW COMPARISON:  Sep 03, 2017 FINDINGS: No pneumothorax. Mild bibasilar opacities are stable on the right and more pronounced on the left in the interval. No overt edema. No change in the cardiomediastinal silhouette. IMPRESSION: Bibasilar opacities again identified, stable on the right and more pronounced on the left. No other interval change. Electronically Signed   By: Dorise Bullion III M.D   On: 10/10/2017 12:02        Scheduled  Meds: . arformoterol  15 mcg Nebulization BID  . atorvastatin  40 mg Oral Daily  . budesonide (PULMICORT) nebulizer solution  0.5 mg Nebulization BID  . busPIRone  10 mg Oral TID  . chlorhexidine  15 mL Mouth Rinse BID  . Chlorhexidine Gluconate Cloth  6 each Topical Q0600  . digoxin  0.125 mg Oral Daily  . diltiazem  180 mg Oral Daily  . enoxaparin (LOVENOX) injection  40 mg Subcutaneous Q24H  . guaiFENesin  1,200 mg Oral BID  . haloperidol lactate  2.5 mg Intravenous Once  . insulin aspart  0-15 Units Subcutaneous TID WC  . insulin detemir  12 Units Subcutaneous QHS  . ipratropium-albuterol  3 mL Nebulization TID  . magnesium oxide  400 mg Oral Q12H  . mouth rinse  15 mL Mouth Rinse q12n4p  . methylPREDNISolone (SOLU-MEDROL) injection  60 mg Intravenous Q12H  . metoprolol succinate  25 mg Oral Daily  . mirtazapine  15 mg Oral QHS  . mupirocin ointment  1 application Nasal BID  . pantoprazole  40 mg Oral Q12H   Continuous Infusions: . cefTRIAXone (ROCEPHIN)  IV Stopped (10/11/17 0949)     LOS: 2 days    Time spent: 40 minutes    Irine Seal, MD [facility] Pager [phone]  If 7PM-7AM, please contact night-coverage [email or web address] Password [facility] 10/12/2017, 9:18 AM

## 2017-10-12 NOTE — Progress Notes (Signed)
Did not tolerate high flow O2, pt complained that o2 flow at 10 liters "burned" her nostrils  and refused to wear.  Pt ask to be placed back on Bipap.  RT notified.  Now on Bipap at 60%, sats 92%.

## 2017-10-12 NOTE — Consult Note (Signed)
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   Patient was admitted by [facility], diuresed and treated with abx but O2 demand failed to drop and PCCM was consulted.   PAST MEDICAL HISTORY :  She  has a past medical history of CHF (congestive heart failure) ([place]), COPD (chronic obstructive pulmonary disease) ([place]), Cor pulmonale (chronic) ([place]), Diabetes mellitus without complication ([place]), Hypertension, Paroxysmal atrial fibrillation ([place]), and Pulmonary hypertension ([place]).  PAST SURGICAL HISTORY: She  has a past surgical history that includes Left Breast Lumpectomy.  Allergies  Allergen Reactions  . Lisinopril Swelling  . Codeine Rash    No current facility-administered medications on file prior to encounter.    Current Outpatient Medications on File Prior to Encounter  Medication Sig  . acetaminophen (TYLENOL) 650 MG CR tablet Take 650 mg by mouth every 8 (eight) hours as needed for pain.  Marland Kitchen ALPRAZolam (XANAX) 0.25 MG tablet Take 0.25 mg by mouth every 12 (twelve) hours as needed for anxiety.  Marland Kitchen atorvastatin (LIPITOR) 40 MG tablet Take 40 mg by mouth daily.  . budesonide (PULMICORT) 0.5 MG/2ML nebulizer solution Take 0.5 mg by nebulization every 12 (twelve) hours.  . busPIRone (BUSPAR) 10 MG tablet Take 10 mg by mouth 3 (three) times daily.  . digoxin (LANOXIN) 0.125 MG tablet Take 0.125 mg by mouth daily.  Marland Kitchen diltiazem (DILACOR XR) 180 MG 24 hr capsule Take 180 mg by mouth daily.  . famotidine (PEPCID) 20 MG tablet Take 20 mg by mouth every 12  (twelve) hours.  . furosemide (LASIX) 40 MG tablet Take 40 mg by mouth daily.  Marland Kitchen guaiFENesin (MUCINEX) 600 MG 12 hr tablet Take 600 mg by mouth 2 (two) times daily.  . insulin detemir (LEVEMIR) 100 UNIT/ML injection Inject 12 Units into the skin every 12 (twelve) hours.  Marland Kitchen ipratropium-albuterol (DUONEB) 0.5-2.5 (3) MG/3ML SOLN Take 2.5 mLs by nebulization every 4 (four) hours while awake.  . levalbuterol (XOPENEX) 0.63 MG/3ML nebulizer solution Take 0.63 mg by nebulization every 4 (four) hours while awake.  . magnesium oxide (MAG-OX) 400 MG tablet Take 400 mg by mouth every 12 (twelve) hours.  . metFORMIN (GLUCOPHAGE) 500 MG tablet Take 500 mg by mouth 2 (two) times daily with a meal.  . metoprolol succinate (TOPROL-XL) 25 MG 24 hr tablet Take 25 mg by mouth daily.  . mirtazapine (REMERON) 15 MG tablet Take 15 mg by mouth at bedtime.  Marland Kitchen morphine (MSIR) 15 MG tablet Take 7.5 mg by mouth every 12 (twelve) hours as needed for severe pain.  . pantoprazole (PROTONIX) 40 MG tablet Take 40 mg by mouth every 12 (twelve) hours.  . potassium chloride SA (K-DUR,KLOR-CON) 20 MEQ tablet Take 40 mEq by mouth daily.  . predniSONE (DELTASONE) 10 MG tablet Take 10-40 mg by mouth See admin instructions. Take 40 mg by mouth daily for 4 days then 30 mg by mouth daily for 4 days then 20 mg by mouth daily for 4 days and then take 10 mg by mouth daily  . sennosides-docusate sodium (SENOKOT-S) 8.6-50 MG  tablet Take 2 tablets by mouth daily as needed for constipation.  . insulin lispro (HUMALOG) 100 UNIT/ML injection Inject 2-10 Units into the skin 3 (three) times daily before meals.  Marland Kitchen morphine (MSIR) 15 MG tablet Take 7.5 mg by mouth every 6 (six) hours as needed (dyspnea).   . oxyCODONE-acetaminophen (PERCOCET/ROXICET) 5-325 MG tablet Take 1 tablet by mouth every 6 (six) hours as needed for severe pain.  . rivaroxaban (XARELTO) 20 MG TABS tablet Take 20 mg by mouth daily.    FAMILY HISTORY:  Her has no family status  information on file.    SOCIAL HISTORY: She  reports that she quit smoking about 4 years ago. Her smoking use included cigarettes. She has never used smokeless tobacco. She reports that she does not drink alcohol or use drugs.  REVIEW OF SYSTEMS:   Negative other than SOB and cough  SUBJECTIVE:  Cough, SOB  VITAL SIGNS: BP 102/69   Pulse 84   Temp (!) 97.2 F (36.2 C) (Axillary)   Resp 19   Ht _0  (1.499 m)   Wt 172 lb 9.9 oz (78.3 kg)   SpO2 98%   BMI 34.87 kg/m   HEMODYNAMICS:    VENTILATOR SETTINGS: FiO2 (%):  [50 %-100 %] 60 %  INTAKE / OUTPUT: I/O last 3 completed shifts: In: 136.7 [P.O.:120; IV Piggyback:16.7] Out: 350 [Urine:350]  PHYSICAL EXAMINATION: General:  Chronically ill appearing female, NAD Neuro:  Alert and interactive, moving all ext to command HEENT:  [place]/AT, PERRL, EOM-I and MMM Cardiovascular:  RRR, Nl S1/S2 and -M/R/G Lungs:  Decreased BS diffusely Abdomen:  Soft, NT, ND and +BS Musculoskeletal:  -edema and -tenderness Skin:  Intact  LABS:  BMET Recent Labs  Lab 10/11/17 0311 10/11/17 1136 10/12/17 0256  NA 144 145 147*  K 5.4* 4.2 3.8  CL 99 99 99  CO2 34* 31 37*  BUN 45* 45* 47*  CREATININE 1.13* 1.05* 1.11*  GLUCOSE 199* 187* 212*    Electrolytes Recent Labs  Lab 10/11/17 0311 10/11/17 1136 10/12/17 0256  CALCIUM 8.6* 8.8* 8.5*    CBC Recent Labs  Lab 10/10/17 1134 10/11/17 0311 10/12/17 0256  WBC 22.4* 18.2* 13.1*  HGB 10.8* 10.0* 9.8*  HCT 36.3 33.5* 32.3*  PLT 181 185 200    Coag's No results for input(s): APTT, INR in the last 168 hours.  Sepsis Markers Recent Labs  Lab 10/10/17 1254 10/10/17 1546 10/11/17 0804 10/12/17 0256  LATICACIDVEN 2.92*  --  1.2  --   PROCALCITON  --  2.25 2.75 1.81    ABG Recent Labs  Lab 10/10/17 1142 10/10/17 1807  PHART 7.389 7.374  PCO2ART 56.9* 55.8*  PO2ART 68.3* 63.7*    Liver Enzymes Recent Labs  Lab 10/10/17 1134 10/11/17 0311  AST 27 15   ALT 38 34  ALKPHOS 107 90  BILITOT 1.1 0.7  ALBUMIN 3.1* 2.7*    Cardiac Enzymes Recent Labs  Lab 10/10/17 1546 10/10/17 2225 10/11/17 0311  TROPONINI 0.10* 0.10* 0.08*    Glucose Recent Labs  Lab 10/10/17 2145 10/11/17 0726 10/11/17 1109 10/11/17 1700 10/11/17 2138 10/12/17 0752  GLUCAP 228* 191* 201* 261* 168* 242*    Imaging Dg Chest Port 1 View  Result Date: 10/12/2017 CLINICAL DATA:  Trouble breathing for 2 days. EXAM: PORTABLE CHEST 1 VIEW COMPARISON:  October 11, 2017 FINDINGS: Cardiomegaly. Left retrocardiac opacity, unchanged. No other interval changes identified. IMPRESSION: Left retrocardiac opacity, stable.  Cardiomegaly.  No other change. Electronically  Signed   By: Dorise Bullion III M.D   On: 10/12/2017 11:19     STUDIES:  CXR 6/29>>>LLL atelectasis vs infiltrate  CULTURES: Blood 6/27>>>STREPTOCOCCUS PNEUMONIAE   ANTIBIOTICS: Rocephin 6/27>>>  SIGNIFICANT EVENTS: 6/28 transfer to SDU  LINES/TUBES: PIV  DISCUSSION: 62 year old female with ES-COPD on 6L [place] at home presenting with strep pneumo pneumonia and CHF.  Presenting to PCCM with failure to wean O2.  ASSESSMENT / PLAN:  PULMONARY A: Acute on chronic respiratory failure, multi factorial with PNA, COPD and pulmonary edema P:   - Keep dry as able (I believe at dry weight at this point) - BiPAP to PRN - Titrate O2 for sat of 88-92% - Steroids as ordered for COPD - Continue abx - Will need to discuss code status given advanced lung disease, full for now per patient  CARDIOVASCULAR A:  SVT and MAT P:  - Beta blocker for HR control as BP allows - Tele monitoring  RENAL A:   Hypernatremia from diureses P:   - Hold further diureses for now and allow patient to equiliberate - BMET in AM - Replace electrolytes as indicated  GASTROINTESTINAL A:   No active issues P:   - GI prophylaxis while on steroids - Caution with BiPAP use and diet  HEMATOLOGIC A:   No active  issues P:  - CBC in AM - Transfuse per ICU protocol  INFECTIOUS A:   STREPTOCOCCUS PNEUMONIAE  P:   - Rocephin - F/U on sensitivities  ENDOCRINE A:   Hyperglycemia   P:   - CBGs - SSI  NEUROLOGIC A:   No active issues P:   RASS goal: N/A - Avoid sedating medications that would depress respiratory drive  FAMILY  - Updates: Patient updated bedside  - Inter-disciplinary family meet or Palliative Care meeting due by:  day 7  The patient is critically ill with multiple organ systems failure and requires high complexity decision making for assessment and support, frequent evaluation and titration of therapies, application of advanced monitoring technologies and extensive interpretation of multiple databases.   Critical Care Time devoted to patient care services described in this note is  31  Minutes. This time reflects time of care of this signee Dr Jennet Maduro. This critical care time does not reflect procedure time, or teaching time or supervisory time of PA/NP/Med student/Med Resident etc but could involve care discussion time.  Rush Farmer, M.D. [facility] Pulmonary/Critical Care Medicine. Pager: [phone]. After hours pager: [phone].  10/12/2017, 11:30 AM

## 2017-10-13 DIAGNOSIS — I480 Paroxysmal atrial fibrillation: Secondary | ICD-10-CM | POA: Diagnosis present

## 2017-10-13 DIAGNOSIS — J810 Acute pulmonary edema: Secondary | ICD-10-CM

## 2017-10-13 LAB — BASIC METABOLIC PANEL
ANION GAP: 11 (ref 5–15)
BUN: 46 mg/dL — ABNORMAL HIGH (ref 8–23)
CHLORIDE: 98 mmol/L (ref 98–111)
CO2: 35 mmol/L — ABNORMAL HIGH (ref 22–32)
Calcium: 8.7 mg/dL — ABNORMAL LOW (ref 8.9–10.3)
Creatinine, Ser: 0.94 mg/dL (ref 0.44–1.00)
GFR calc Af Amer: >60 mL/min (ref >60)
GFR calc non Af Amer: >60 mL/min (ref >60)
GLUCOSE: 281 mg/dL — AB (ref 70–99)
Potassium: 4.2 mmol/L (ref 3.5–5.1)
Sodium: 144 mmol/L (ref 135–145)

## 2017-10-13 LAB — CBC WITH DIFFERENTIAL/PLATELET
BASOS ABS: 0 10*3/uL (ref 0.0–0.1)
Basophils Relative: 0 %
EOS ABS: 0 10*3/uL (ref 0.0–0.7)
Eosinophils Relative: 0 %
HCT: 35.6 % — ABNORMAL LOW (ref 36.0–46.0)
HEMOGLOBIN: 10.4 g/dL — AB (ref 12.0–15.0)
Lymphocytes Relative: 4 %
Lymphs Abs: 0.4 10*3/uL — ABNORMAL LOW (ref 0.7–4.0)
MCH: 28.3 pg (ref 26.0–34.0)
MCHC: 29.2 g/dL — ABNORMAL LOW (ref 30.0–36.0)
MCV: 97 fL (ref 78.0–100.0)
MONO ABS: 0.2 10*3/uL (ref 0.1–1.0)
MONOS PCT: 2 %
Neutro Abs: 8 10*3/uL — ABNORMAL HIGH (ref 1.7–7.7)
Neutrophils Relative %: 94 %
Platelets: 189 10*3/uL (ref 150–400)
RBC: 3.67 MIL/uL — ABNORMAL LOW (ref 3.87–5.11)
RDW: 16.3 % — ABNORMAL HIGH (ref 11.5–15.5)
WBC: 8.5 10*3/uL (ref 4.0–10.5)

## 2017-10-13 LAB — CULTURE, BLOOD (ROUTINE X 2): SPECIAL REQUESTS: ADEQUATE

## 2017-10-13 LAB — PROCALCITONIN: Procalcitonin: 0.68 ng/mL

## 2017-10-13 LAB — GLUCOSE, CAPILLARY
GLUCOSE-CAPILLARY: 228 mg/dL — AB (ref 70–99)
GLUCOSE-CAPILLARY: 289 mg/dL — AB (ref 70–99)
Glucose-Capillary: 153 mg/dL — ABNORMAL HIGH (ref 70–99)
Glucose-Capillary: 255 mg/dL — ABNORMAL HIGH (ref 70–99)

## 2017-10-13 MED ORDER — FUROSEMIDE 40 MG PO TABS: 40.0000 mg | ORAL_TABLET | Freq: Every day | ORAL | Status: DC

## 2017-10-13 MED ORDER — FUROSEMIDE 40 MG PO TABS
40.0000 mg | ORAL_TABLET | Freq: Every day | ORAL | Status: DC
  Administered 2017-10-14 – 2017-10-15 (×2): 40 mg via ORAL
  Filled 2017-10-13 (×2): qty 1

## 2017-10-13 MED ORDER — ALPRAZOLAM 0.25 MG PO TABS
0.2500 mg | ORAL_TABLET | Freq: Two times a day (BID) | ORAL | Status: DC | PRN
  Administered 2017-10-13 – 2017-10-16 (×3): 0.25 mg via ORAL
  Filled 2017-10-13 (×3): qty 1

## 2017-10-13 MED ORDER — FAMOTIDINE 20 MG PO TABS
20.0000 mg | ORAL_TABLET | Freq: Two times a day (BID) | ORAL | Status: DC
  Administered 2017-10-13 – 2017-10-22 (×16): 20 mg via ORAL
  Filled 2017-10-13 (×16): qty 1

## 2017-10-13 MED ORDER — METOPROLOL SUCCINATE ER 25 MG PO TB24
50.0000 mg | ORAL_TABLET | Freq: Every day | ORAL | Status: DC
  Administered 2017-10-13 – 2017-10-19 (×7): 50 mg via ORAL
  Filled 2017-10-13 (×7): qty 2

## 2017-10-13 MED ORDER — FUROSEMIDE 10 MG/ML IJ SOLN
40.0000 mg | Freq: Once | INTRAMUSCULAR | Status: AC
  Administered 2017-10-13: 40 mg via INTRAVENOUS
  Filled 2017-10-13: qty 4

## 2017-10-13 MED ORDER — INSULIN DETEMIR 100 UNIT/ML ~~LOC~~ SOLN
12.0000 [IU] | Freq: Two times a day (BID) | SUBCUTANEOUS | Status: DC
  Administered 2017-10-13 (×2): 12 [IU] via SUBCUTANEOUS
  Filled 2017-10-13 (×4): qty 0.12

## 2017-10-13 NOTE — Progress Notes (Addendum)
PROGRESS NOTE    Vanessa Alexander  AST:[id number] DOB: 1955/11/03 DOA: 10/10/2017 PCP: Townsend Roger, MD    Brief Narrative:  Vanessa Alexander is a 62 y.o. 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  Patient open BiPAP during my interview, however wake up and answer questions.  Patient was hospitalized 1 month ago at [facility] due to COPD exacerbation and pneumonia and was discharged to SNF for short-term rehab.  She just left the facility 1 day ago.  Patient reported that she was having difficulty breathing and worsening of her baseline shortness of breath for the past week or so and apparently this was not addressed.  Patient was unable to walk around her house with her baseline 6 L nasal cannula, developing cough and generalized fatigue.  Patient denies chest pain, palpitations and dizziness.  ED Course: EMS found patient with oxygen saturation in the low 80s, gave her steroids and placed on BiPAP.  Labs remarkable for WBC of 22, chest x-ray with increasing vascular congestion, and bibasilar opacities.  Elevated creatinine at 1.7 and mild elevated lactic acid at 2.9.  Patient was given IV fluids and antibiotics by EDP.      Assessment & Plan:   Principal Problem:   Pneumococcal bacteremia Active Problems:   Acute on chronic respiratory failure (HCC)   HCAP (healthcare-associated pneumonia)   Acute on chronic diastolic CHF (congestive heart failure) (HCC)   COPD (chronic obstructive pulmonary disease) (HCC)   HTN (hypertension)   AKI (acute kidney injury) (HCC)   DM (diabetes mellitus) (HCC)   Lactic acidosis   Elevated troponin   Hyperkalemia   AF (paroxysmal atrial fibrillation) (HCC)  #1 acute on chronic respiratory failure with hypoxia and hypercarbia Likely multifactorial secondary to HCAP, acute COPD  exacerbation( on 6 L nasal cannula baseline), acute on chronic diastolic CHF exacerbation.  Patient noted on admission to have lower extremity edema, with hypoxia and hypercarbia.  Chest x-ray which was done showed bibasilar infiltrates left greater than right.  Patient placed on the BiPAP and had been on the BiPAP overnight.  Patient with some clinical improvement.  Patient was transitioned to nonrebreather and feeling better than she did on 10/12/2017.  Patient states she is on BiPAP nightly.   Chest x-ray on admission was concerning for volume overload BNP was elevated at 726 with mildly elevated troponin.  Patient placed on IV Lasix, empiric IV vancomycin IV cefepime.  Blood cultures positive for strep pneumococcus.  Patient with a urine output not accurately recorded.  Currently off BiPAP.  IV vancomycin and IV cefepime have been transitioned to IV Rocephin.  Place on high flow nasal cannula and keep sats 88 to 93%.  Repeat chest x-ray stable with left retrocardiac opacity and unchanged. Continue Brovana, Pulmicort, Mucinex, scheduled duo nebs, IV Solu-Medrol, PPI.  Transition from IV Lasix to oral Lasix tomorrow 10/14/2017.  Patient to get Lasix 40 mg IV x1 today.  PCCM following and I appreciate the input and recommendations..    2.  Gram-positive bacteremia secondary to Streptococcus pneumonia Preliminary blood cultures with gram-positive bacteremia with BCID showing Streptococcus pneumoniae.  Patient presented with acute on chronic respiratory failure with concerns for healthcare associated pneumonia.  Discontinued IV vancomycin and IV cefepime.  Continue IV Rocephin.  Likely repeat blood cultures in the next 24 to 48 hours.  Patient  has been seen in consultation by ID who recommend continuing IV Rocephin while susceptibilities are pending and recommending a total of 10 days of antibiotic therapy at this time.  Appreciate ID input and recommendations.    3.  Acute on chronic diastolic heart  failure Patient presented with worsening shortness of breath, chest x-ray worrisome for fluid overload, BNP elevated at 726 with mildly elevated troponin.  Patient also noted to have some lower extremity edema.  Patient with recent 2D echo done 07/25/2017 with normal systolic function, mild diastolic dysfunction, moderate LVH, D-shaped septum, severe dilated right atrium, increased right atrial pressure with a PA peak pressure of 58 mmHg.  Patient placed on IV Lasix with urine output not correctly recorded.  Current weight of 180 pounds from 172 pounds from 179 pounds on 10/11/2017 from 176 pounds on 10/10/2017.  IV Lasix was discontinued 10/12/2017.  Patient to receive Lasix 40 mg IV x1.  Resume home dose Lasix 40 mg daily tomorrow 10/14/2017.  Strict I's and O's.  Daily weights.   4. HCAP Patient noted to have recently been discharged from [facility] due to COPD and pneumonia was at a skilled nursing facility.  Patient presented with cough, leukocytosis.  Blood cultures positive for Streptococcus pneumonia.  Urine Legionella antigen positive.  Urine pneumococcus antigen negative.  Urine Legionella antigen pending.  MRSA PCR is positive.  Repeat procalcitonin level and lactic acid level which were elevated on admission are trending down..  Continue supportive treatment.  Discontinued IV vancomycin and IV cefepime and transitioned to IV Rocephin as blood cultures positive for Streptococcus pneumonia.  BiPAP QHS.  Supportive care.  5.  Acute COPD exacerbation/chronic respiratory failure on 6 L nasal cannula home O2 No wheezing noted on examination.  Patient however with poor-fair air movement on 10/12/2017 with worsening shortness of breath.  Patient also being treated for pneumonia.  Patient placed on IV steroid taper.  Continue current nebulizer treatments, Pulmicort, Brovana, duo nebs, Mucinex, empiric IV Rocephin.  Repeat chest x-ray with left retrocardiac opacity unchanged.  Keep sats between 88 to 93%.  BiPAP  nightly  6.  Acute kidney injury Likely cardiorenal.  Renal function improving with diuresis.  IV Lasix was held yesterday.  Patient to receive Lasix 40 mg IV x1.  Will resume home dose Lasix 40 mg daily starting tomorrow 10/14/2017.   7.  Hyperkalemia Status post Kayexalate.  Hyperkalemia resolved.  Follow.    8.  Lactic acidosis Likely secondary to problem #1.  Continue current treatment as in problem #1.  Repeat lactic acid level with improvement.  Procalcitonin level trending down.    9.  Elevated troponin Likely secondary to demand ischemia from acute CHF exacerbation.  EKG with no ischemic changes noted.  Patient with recent 2D echo done and as such will not repeat.  Follow.  10.  Hypertension Stable.  Continue Cardizem, Lasix, Toprol-XL.  Follow.    11.  Type 2 diabetes mellitus Patient on clear liquids.  Hemoglobin A1c was 7.2 on 08/01/2017.  CBGs 228 this morning.  Patient on IV steroids and likely because of increased CBGs.  Increase Levemir back to home dose of 12 units twice daily.  Continue sliding scale insulin.  12.  SVT versus MAT/tachycardia vs hx of paroxysmal A. fib Likely secondary to acute pulmonary issues.  Check a EKG.  Continue home dose Cardizem.  Increase Toprol-XL to 50 mg daily for better rate control.  IV Lopressor as needed.  Patient states had a history of  recent bleed and as such anticoagulation was discontinued.  We will hold off on anticoagulation at this time.     DVT prophylaxis: Lovenox Code Status: Full Family Communication: Updated patient.  No family at bedside. Disposition Plan: Remain in stepdown unit.  To be determined.   Consultants:   ID: Dr. Megan Salon 10/11/2017  PCCM: Dr. Nelda Marseille 10/12/2017  Procedures:   Chest x-ray 10/10/2017, 10/11/2017, 10/12/2017    Antimicrobials:   IV vancomycin 10/10/2017>>>>> 10/11/2017  IV cefepime 10/10/2017>>>>> 10/11/2017  IV Rocephin 10/11/2017   Subjective: Patient was on BiPAP overnight states she  uses BiPAP nightly at home.  Currently on nonrebreather.  States she is feeling better than yesterday.  Shortness of breath slowly improving.  No chest pain.  Asking to go to the bathroom.    Objective: Vitals:   10/13/17 0430 10/13/17 0500 10/13/17 0746 10/13/17 0800  BP: (!) 116/92 94/75    Pulse: 79 (!) 105 (!) 106   Resp: _0 Temp:    (!) 96.7 F (35.9 C)  TempSrc:    Axillary  SpO2: 92% 93% 93%   Weight:      Height:        Intake/Output Summary (Last 24 hours) at 10/13/2017 0940 Last data filed at 10/12/2017 2109 Gross per 24 hour  Intake 1520 ml  Output -  Net 1520 ml   Filed Weights   10/11/17 0500 10/12/17 0400 10/13/17 0400  Weight: 81.2 kg (179 lb 0.2 oz) 78.3 kg (172 lb 9.9 oz) 81.8 kg (180 lb 5.4 oz)    Examination:  General exam: On NRB.  More alert. Respiratory system: Decreased BS in bases, with some coarse BS/crackles in left base. No wheezing. Fair air movement. Cardiovascular system: Tachycardic.  No JVD.  No murmurs rubs or gallops.  1-2+ bilateral lower extremity edema.  Gastrointestinal system: Abdomen is nontender, nondistended, soft, positive bowel sounds.  No rebound.  No guarding.   Central nervous system: Alert and oriented. No focal neurological deficits. Extremities: Symmetric 5 x 5 power. Skin: No rashes, lesions or ulcers Psychiatry: Judgement and insight appear normal. Mood & affect appropriate.     Data Reviewed: I have personally reviewed following labs and imaging studies  CBC: Recent Labs  Lab 10/10/17 1134 10/11/17 0311 10/12/17 0256 10/13/17 0321  WBC 22.4* 18.2* 13.1* 8.5  NEUTROABS 20.6*  --  12.3* 8.0*  HGB 10.8* 10.0* 9.8* 10.4*  HCT 36.3 33.5* 32.3* 35.6*  MCV 95.3 95.2 95.0 97.0  PLT 181 185 200 832   Basic Metabolic Panel: Recent Labs  Lab 10/10/17 1134 10/11/17 0311 10/11/17 1136 10/12/17 0256 10/13/17 0321  NA 141 144 145 147* 144  K 5.2* 5.4* 4.2 3.8 4.2  CL 95* 99 99 99 98  CO2 31 34* 31 37* 35*   GLUCOSE 247* 199* 187* 212* 281*  BUN 38* 45* 45* 47* 46*  CREATININE 1.24* 1.13* 1.05* 1.11* 0.94  CALCIUM 8.7* 8.6* 8.8* 8.5* 8.7*   GFR: Estimated Creatinine Clearance: 55.9 mL/min (by C-G formula based on SCr of 0.94 mg/dL). Liver Function Tests: Recent Labs  Lab 10/10/17 1134 10/11/17 0311  AST 27 15  ALT 38 34  ALKPHOS 107 90  BILITOT 1.1 0.7  PROT 6.8 6.2*  ALBUMIN 3.1* 2.7*   No results for input(s): LIPASE, AMYLASE in the last 168 hours. No results for input(s): AMMONIA in the last 168 hours. Coagulation Profile: No results for input(s): INR, PROTIME in the last 168 hours. Cardiac Enzymes:  Recent Labs  Lab 10/10/17 1134 10/10/17 1546 10/10/17 2225 10/11/17 0311  TROPONINI 0.10* 0.10* 0.10* 0.08*   BNP (last 3 results) No results for input(s): PROBNP in the last 8760 hours. HbA1C: No results for input(s): HGBA1C in the last 72 hours. CBG: Recent Labs  Lab 10/12/17 0752 10/12/17 1221 10/12/17 1704 10/12/17 2122 10/13/17 0735  GLUCAP 242* 307* 150* 314* 228*   Lipid Profile: No results for input(s): CHOL, HDL, LDLCALC, TRIG, CHOLHDL, LDLDIRECT in the last 72 hours. Thyroid Function Tests: No results for input(s): TSH, T4TOTAL, FREET4, T3FREE, THYROIDAB in the last 72 hours. Anemia Panel: No results for input(s): VITAMINB12, FOLATE, FERRITIN, TIBC, IRON, RETICCTPCT in the last 72 hours. Sepsis Labs: Recent Labs  Lab 10/10/17 1254 10/10/17 1546 10/11/17 0804 10/12/17 0256 10/13/17 0321  PROCALCITON  --  2.25 2.75 1.81 0.68  LATICACIDVEN 2.92*  --  1.2  --   --     Recent Results (from the past 240 hour(s))  Blood culture (routine x 2)     Status: None (Preliminary result)   Collection Time: 10/10/17 12:08 PM  Result Value Ref Range Status   Specimen Description   Final    BLOOD RIGHT ARM Performed at [facility] [address], [place], [place] [address]    Special Requests   Final    BOTTLES DRAWN AEROBIC AND ANAEROBIC Blood  Culture adequate volume Performed at [facility] [address] [place], [place] [address]    Culture   Final    NO GROWTH 2 DAYS Performed at [facility] [address] [place], [place] [address]    Report Status PENDING  Incomplete  Blood culture (routine x 2)     Status: Abnormal   Collection Time: 10/10/17 12:13 PM  Result Value Ref Range Status   Specimen Description   Final    BLOOD LEFT ARM Performed at [facility], [place] [address], [place], [place] [address]    Special Requests   Final    BOTTLES DRAWN AEROBIC AND ANAEROBIC Blood Culture adequate volume Performed at [facility] [address] [place], [place] [address]    Culture  Setup Time   Final    AEROBIC BOTTLE ONLY GRAM POSITIVE COCCI CRITICAL RESULT CALLED TO, READ BACK BY AND VERIFIED WITH: Colin Rhein [facility] 10/11/17 7106 JDW Performed at [facility], [address] [place], [place] [address]    Culture STREPTOCOCCUS PNEUMONIAE (A)  Final   Report Status 10/13/2017 FINAL  Final   Organism ID, Bacteria STREPTOCOCCUS PNEUMONIAE  Final      Susceptibility   Streptococcus pneumoniae - MIC*    ERYTHROMYCIN <=0.12 SENSITIVE Sensitive     LEVOFLOXACIN 0.5 SENSITIVE Sensitive     PENICILLIN (meningitis) <=0.06 SENSITIVE Sensitive     PENICILLIN (non-meningitis) <=0.06 SENSITIVE Sensitive     CEFTRIAXONE (non-meningitis) <=0.12 SENSITIVE Sensitive     CEFTRIAXONE (meningitis) <=0.12 SENSITIVE Sensitive     * STREPTOCOCCUS PNEUMONIAE  Blood Culture ID Panel (Reflexed)     Status: Abnormal   Collection Time: 10/10/17 12:13 PM  Result Value Ref Range Status   Enterococcus species NOT DETECTED NOT DETECTED Final   Listeria monocytogenes NOT DETECTED NOT DETECTED Final   Staphylococcus species NOT DETECTED NOT DETECTED Final   Staphylococcus aureus NOT DETECTED NOT DETECTED Final   Streptococcus species DETECTED (A) NOT DETECTED Final    Comment: CRITICAL  RESULT CALLED TO, READ BACK BY  AND VERIFIED WITH: Colin Rhein [facility] 10/11/17 3546 JDW    Streptococcus agalactiae NOT DETECTED NOT DETECTED Final   Streptococcus pneumoniae DETECTED (A) NOT DETECTED Final    Comment: CRITICAL RESULT CALLED TO, READ BACK BY AND VERIFIED WITH: Jerilynn Mages [facility] 10/11/17 5681 JDW    Streptococcus pyogenes NOT DETECTED NOT DETECTED Final   Acinetobacter baumannii NOT DETECTED NOT DETECTED Final   Enterobacteriaceae species NOT DETECTED NOT DETECTED Final   Enterobacter cloacae complex NOT DETECTED NOT DETECTED Final   Escherichia coli NOT DETECTED NOT DETECTED Final   Klebsiella oxytoca NOT DETECTED NOT DETECTED Final   Klebsiella pneumoniae NOT DETECTED NOT DETECTED Final   Proteus species NOT DETECTED NOT DETECTED Final   Serratia marcescens NOT DETECTED NOT DETECTED Final   Haemophilus influenzae NOT DETECTED NOT DETECTED Final   Neisseria meningitidis NOT DETECTED NOT DETECTED Final   Pseudomonas aeruginosa NOT DETECTED NOT DETECTED Final   Candida albicans NOT DETECTED NOT DETECTED Final   Candida glabrata NOT DETECTED NOT DETECTED Final   Candida krusei NOT DETECTED NOT DETECTED Final   Candida parapsilosis NOT DETECTED NOT DETECTED Final   Candida tropicalis NOT DETECTED NOT DETECTED Final    Comment: Performed at [facility], [place] [address], [place], [place] [address]  MRSA PCR Screening     Status: Abnormal   Collection Time: 10/11/17  7:54 AM  Result Value Ref Range Status   MRSA by PCR POSITIVE (A) NEGATIVE Final    Comment:        The GeneXpert MRSA Assay (FDA approved for NASAL specimens only), is one component of a comprehensive MRSA colonization surveillance program. It is not intended to diagnose MRSA infection nor to guide or monitor treatment for MRSA infections. RESULT CALLED TO, READ BACK BY AND VERIFIED WITH: DUONG,T. RN _0  ON 06.28.19 BY COHEN,K Performed at [facility], [place] [address], [place],  [place] [address]          Radiology Studies: Dg Chest Port 1 View  Result Date: 10/12/2017 CLINICAL DATA:  Trouble breathing for 2 days. EXAM: PORTABLE CHEST 1 VIEW COMPARISON:  October 11, 2017 FINDINGS: Cardiomegaly. Left retrocardiac opacity, unchanged. No other interval changes identified. IMPRESSION: Left retrocardiac opacity, stable.  Cardiomegaly.  No other change. Electronically Signed   By: Dorise Bullion III M.D   On: 10/12/2017 11:19        Scheduled Meds: . arformoterol  15 mcg Nebulization BID  . atorvastatin  40 mg Oral Daily  . budesonide (PULMICORT) nebulizer solution  0.5 mg Nebulization BID  . busPIRone  10 mg Oral TID  . chlorhexidine  15 mL Mouth Rinse BID  . Chlorhexidine Gluconate Cloth  6 each Topical Q0600  . digoxin  0.125 mg Oral Daily  . diltiazem  180 mg Oral Daily  . enoxaparin (LOVENOX) injection  40 mg Subcutaneous Q24H  . furosemide  40 mg Intravenous Once  . [START ON 10/14/2017] furosemide  40 mg Oral Daily  . guaiFENesin  1,200 mg Oral BID  . haloperidol lactate  2.5 mg Intravenous Once  . insulin aspart  0-15 Units Subcutaneous TID WC  . insulin detemir  12 Units Subcutaneous BID  . ipratropium-albuterol  3 mL Nebulization TID  . magnesium oxide  400 mg Oral Q12H  . mouth rinse  15 mL Mouth Rinse q12n4p  . methylPREDNISolone (SOLU-MEDROL) injection  60 mg Intravenous Q12H  . metoprolol succinate  25 mg Oral Daily  . mirtazapine  15  mg Oral QHS  . mupirocin ointment  1 application Nasal BID  . pantoprazole  40 mg Oral Q12H   Continuous Infusions: . cefTRIAXone (ROCEPHIN)  IV Stopped (10/12/17 1111)     LOS: 3 days    Time spent: 40 minutes    Irine Seal, MD [facility] Pager [phone]  If 7PM-7AM, please contact night-coverage [email or web address] Password [email or web address] 10/13/2017, 9:40 AM

## 2017-10-13 NOTE — Progress Notes (Addendum)
PULMONARY / CRITICAL CARE MEDICINE   Name: Vanessa Alexander MRN: [id number] DOB: 07/16/1955    ADMISSION DATE:  10/10/2017 CONSULTATION DATE:  10/12/2017  REFERRING MD:  Grandville Silos, TRH-MD  CHIEF COMPLAINT:  Respiratory failure  HISTORY OF PRESENT ILLNESS:   62 year old female with history of O2 dependent COPD on 6L at home was recently hospitalized [place] and discharged to SNF who comes back from SNF with SVT developing PNA and pulmonary edema resulting in worsening respiratory failure.   Patient was admitted by [facility], diuresed and treated with abx but O2 demand failed to drop and PCCM was consulted.  SUBJECTIVE:  No events overnight, used BiPAP, no new complaints  VITAL SIGNS: BP 94/75   Pulse (!) 106   Temp (!) 96.7 F (35.9 C) (Axillary)   Resp 18   Ht _0  (1.499 m)   Wt 180 lb 5.4 oz (81.8 kg)   SpO2 93%   BMI 36.42 kg/m   HEMODYNAMICS:    VENTILATOR SETTINGS: FiO2 (%):  [40 %-60 %] 50 %  INTAKE / OUTPUT: I/O last 3 completed shifts: In: 1760 [P.O.:1560; IV Piggyback:200] Out: -   PHYSICAL EXAMINATION: General:  Chronically ill appearing female, NAD Neuro:  Alert and interactive, moving all ext to command HEENT:  [place]/AT, PERRL, EOM-I and MMM Cardiovascular:  RRR, Nl S1/S2 and -M/R/G Lungs:  diminished diffusely Abdomen:  Soft, NT, ND and +BS Musculoskeletal:  -edema and -tenderness Skin:  Intact  LABS:  BMET Recent Labs  Lab 10/11/17 1136 10/12/17 0256 10/13/17 0321  NA 145 147* 144  K 4.2 3.8 4.2  CL 99 99 98  CO2 31 37* 35*  BUN 45* 47* 46*  CREATININE 1.05* 1.11* 0.94  GLUCOSE 187* 212* 281*   Electrolytes Recent Labs  Lab 10/11/17 1136 10/12/17 0256 10/13/17 0321  CALCIUM 8.8* 8.5* 8.7*   CBC Recent Labs  Lab 10/11/17 0311 10/12/17 0256 10/13/17 0321  WBC 18.2* 13.1* 8.5  HGB 10.0* 9.8* 10.4*  HCT 33.5* 32.3* 35.6*  PLT 185 200 189   Coag's No results for input(s): APTT, INR in the last 168 hours.  Sepsis Markers Recent  Labs  Lab 10/10/17 1254  10/11/17 0804 10/12/17 0256 10/13/17 0321  LATICACIDVEN 2.92*  --  1.2  --   --   PROCALCITON  --    < > 2.75 1.81 0.68   < > = values in this interval not displayed.   ABG Recent Labs  Lab 10/10/17 1142 10/10/17 1807  PHART 7.389 7.374  PCO2ART 56.9* 55.8*  PO2ART 68.3* 63.7*   Liver Enzymes Recent Labs  Lab 10/10/17 1134 10/11/17 0311  AST 27 15  ALT 38 34  ALKPHOS 107 90  BILITOT 1.1 0.7  ALBUMIN 3.1* 2.7*   Cardiac Enzymes Recent Labs  Lab 10/10/17 1546 10/10/17 2225 10/11/17 0311  TROPONINI 0.10* 0.10* 0.08*   Glucose Recent Labs  Lab 10/11/17 2138 10/12/17 0752 10/12/17 1221 10/12/17 1704 10/12/17 2122 10/13/17 0735  GLUCAP 168* 242* 307* 150* 314* 228*   Imaging Dg Chest Port 1 View  Result Date: 10/12/2017 CLINICAL DATA:  Trouble breathing for 2 days. EXAM: PORTABLE CHEST 1 VIEW COMPARISON:  October 11, 2017 FINDINGS: Cardiomegaly. Left retrocardiac opacity, unchanged. No other interval changes identified. IMPRESSION: Left retrocardiac opacity, stable.  Cardiomegaly.  No other change. Electronically Signed   By: Dorise Bullion III M.D   On: 10/12/2017 11:19   STUDIES:  CXR 6/29>>>LLL atelectasis vs infiltrate  CULTURES: Blood 6/27>>>STREPTOCOCCUS PNEUMONIAE  ANTIBIOTICS: Rocephin 6/27>>>  SIGNIFICANT EVENTS: 6/28 transfer to SDU  LINES/TUBES: PIV  I reviewed CXR myself, hyperinflation noted  DISCUSSION: 62 year old female with ES-COPD on 6L [place] at home presenting with strep pneumo pneumonia and CHF.  Presenting to PCCM with failure to wean O2.  Discussed with PCCM-NP  ASSESSMENT / PLAN:  PULMONARY A: Acute on chronic respiratory failure, multi factorial with PNA, COPD and pulmonary edema P:   - Keep dry as able, will give an extra dose of lasix today - BiPAP at night and PRN throughout the day - Titrate O2 for sat of 88-92% - Agree with steroid dose - Continue abx for strep pneumonia    CARDIOVASCULAR A:  SVT and MAT P:  - Beta blockers as BP allows for HR control (MAT) - Tele monitoring  RENAL A:   Hypernatremia from diureses P:   - Lasix 40 mg IV x1 now - BMET in AM - Replace electrolytes as indicated - Continue lasix 40 mg PO dailys  GASTROINTESTINAL A:   No active issues P:   - GI prophylaxis while on steroids - Caution with BiPAP and diet  HEMATOLOGIC A:   No active issues P:  - CBC in AM - Transfuse per ICU protocol  INFECTIOUS A:   STREPTOCOCCUS PNEUMONIAE  P:   - Continue rocephin - F/U on sensitivities  ENDOCRINE A:   Hyperglycemia   P:   - CBGs - SSI while on steroids  NEUROLOGIC A:   No active issues P:   RASS goal: N/A - Avoid sedating medications that will result in respiratory suppression  FAMILY  - Updates: Patient updated bedside.  Maintain in the ICU given BiPAP need. disease.  - Inter-disciplinary family meet or Palliative Care meeting due by:  day 7  Rush Farmer, M.D. [facility] Pulmonary/Critical Care Medicine. Pager: [phone]. After hours pager: [phone].  10/13/2017, 9:02 AM

## 2017-10-14 DIAGNOSIS — J13 Pneumonia due to Streptococcus pneumoniae: Principal | ICD-10-CM

## 2017-10-14 DIAGNOSIS — R0602 Shortness of breath: Secondary | ICD-10-CM

## 2017-10-14 DIAGNOSIS — I5033 Acute on chronic diastolic (congestive) heart failure: Secondary | ICD-10-CM

## 2017-10-14 DIAGNOSIS — N179 Acute kidney failure, unspecified: Secondary | ICD-10-CM

## 2017-10-14 LAB — CBC
HEMATOCRIT: 35.5 % — AB (ref 36.0–46.0)
Hemoglobin: 10.3 g/dL — ABNORMAL LOW (ref 12.0–15.0)
MCH: 28.4 pg (ref 26.0–34.0)
MCHC: 29 g/dL — ABNORMAL LOW (ref 30.0–36.0)
MCV: 97.8 fL (ref 78.0–100.0)
PLATELETS: 203 10*3/uL (ref 150–400)
RBC: 3.63 MIL/uL — ABNORMAL LOW (ref 3.87–5.11)
RDW: 16.2 % — AB (ref 11.5–15.5)
WBC: 7.9 10*3/uL (ref 4.0–10.5)

## 2017-10-14 LAB — GLUCOSE, CAPILLARY
Glucose-Capillary: 295 mg/dL — ABNORMAL HIGH (ref 70–99)
Glucose-Capillary: 337 mg/dL — ABNORMAL HIGH (ref 70–99)
Glucose-Capillary: 345 mg/dL — ABNORMAL HIGH (ref 70–99)
Glucose-Capillary: 191 mg/dL — ABNORMAL HIGH (ref 70–99)

## 2017-10-14 LAB — MAGNESIUM: Magnesium: 2.5 mg/dL — ABNORMAL HIGH (ref 1.7–2.4)

## 2017-10-14 LAB — BASIC METABOLIC PANEL
Anion gap: 9 (ref 5–15)
BUN: 51 mg/dL — ABNORMAL HIGH (ref 8–23)
CALCIUM: 8.6 mg/dL — AB (ref 8.9–10.3)
CO2: 35 mmol/L — AB (ref 22–32)
CREATININE: 1.05 mg/dL — AB (ref 0.44–1.00)
Chloride: 96 mmol/L — ABNORMAL LOW (ref 98–111)
GFR calc non Af Amer: 55 mL/min — ABNORMAL LOW (ref >60)
GLUCOSE: 347 mg/dL — AB (ref 70–99)
Potassium: 4.5 mmol/L (ref 3.5–5.1)
Sodium: 140 mmol/L (ref 135–145)

## 2017-10-14 LAB — PHOSPHORUS: Phosphorus: 3.1 mg/dL (ref 2.5–4.6)

## 2017-10-14 LAB — LEGIONELLA PNEUMOPHILA SEROGP 1 UR AG: L. pneumophila Serogp 1 Ur Ag: NEGATIVE

## 2017-10-14 MED ORDER — INSULIN DETEMIR 100 UNIT/ML ~~LOC~~ SOLN
20.0000 [IU] | Freq: Two times a day (BID) | SUBCUTANEOUS | Status: DC
  Administered 2017-10-14 – 2017-10-16 (×5): 20 [IU] via SUBCUTANEOUS
  Filled 2017-10-14 (×6): qty 0.2

## 2017-10-14 MED ORDER — INSULIN DETEMIR 100 UNIT/ML ~~LOC~~ SOLN
15.0000 [IU] | Freq: Two times a day (BID) | SUBCUTANEOUS | Status: DC
  Administered 2017-10-14: 15 [IU] via SUBCUTANEOUS
  Filled 2017-10-14 (×2): qty 0.15

## 2017-10-14 MED ORDER — WITCH HAZEL-GLYCERIN EX PADS
MEDICATED_PAD | CUTANEOUS | Status: DC | PRN
  Administered 2017-10-15: 15:00:00 via TOPICAL
  Filled 2017-10-14: qty 100

## 2017-10-14 MED ORDER — ARFORMOTEROL TARTRATE 15 MCG/2ML IN NEBU: 15.0000 ug | INHALATION_SOLUTION | Freq: Two times a day (BID) | RESPIRATORY_TRACT | Status: DC

## 2017-10-14 MED ORDER — METHYLPREDNISOLONE SODIUM SUCC 40 MG IJ SOLR
30.0000 mg | Freq: Every day | INTRAMUSCULAR | Status: DC
  Administered 2017-10-15: 30 mg via INTRAVENOUS
  Filled 2017-10-14: qty 1

## 2017-10-14 MED ORDER — SALINE SPRAY 0.65 % NA SOLN
1.0000 | NASAL | Status: DC | PRN
  Filled 2017-10-14: qty 44

## 2017-10-14 NOTE — Progress Notes (Signed)
INFECTIOUS DISEASE PROGRESS NOTE  ID: Vanessa Alexander is a 62 y.o. female with  Principal Problem:   Pneumococcal bacteremia Active Problems:   Acute on chronic respiratory failure (HCC)   HCAP (healthcare-associated pneumonia)   Acute on chronic diastolic CHF (congestive heart failure) (HCC)   COPD (chronic obstructive pulmonary disease) (HCC)   HTN (hypertension)   AKI (acute kidney injury) (HCC)   DM (diabetes mellitus) (HCC)   Lactic acidosis   Elevated troponin   Hyperkalemia   AF (paroxysmal atrial fibrillation) ([place])  Subjective: Wants her bipap back on   Abtx:  Anti-infectives (From admission, onward)   Start     Dose/Rate Route Frequency Ordered Stop   10/12/17 0000  vancomycin (VANCOCIN) IVPB 750 mg/150 ml premix  Status:  Discontinued     750 mg 150 mL/hr over 60 Minutes Intravenous Every 36 hours 10/10/17 1532 10/11/17 1010   10/11/17 1000  cefTRIAXone (ROCEPHIN) 2 g in sodium chloride 0.9 % 100 mL IVPB     2 g 200 mL/hr over 30 Minutes Intravenous Every 24 hours 10/11/17 0810     10/10/17 2200  ceFEPIme (MAXIPIME) 2 g in sodium chloride 0.9 % 100 mL IVPB  Status:  Discontinued     2 g 200 mL/hr over 30 Minutes Intravenous Every 12 hours 10/10/17 1532 10/11/17 0810   10/10/17 1230  ceFEPIme (MAXIPIME) 2 g in sodium chloride 0.9 % 100 mL IVPB     2 g 200 mL/hr over 30 Minutes Intravenous  Once 10/10/17 1225 10/10/17 1312   10/10/17 1230  vancomycin (VANCOCIN) 1,500 mg in sodium chloride 0.9 % 500 mL IVPB     1,500 mg 250 mL/hr over 120 Minutes Intravenous  Once 10/10/17 1225 10/10/17 1604      Medications:  Scheduled: . arformoterol  15 mcg Nebulization BID  . atorvastatin  40 mg Oral Daily  . budesonide (PULMICORT) nebulizer solution  0.5 mg Nebulization BID  . busPIRone  10 mg Oral TID  . chlorhexidine  15 mL Mouth Rinse BID  . Chlorhexidine Gluconate Cloth  6 each Topical Q0600  . digoxin  0.125 mg Oral Daily  . diltiazem  180 mg Oral Daily  .  enoxaparin (LOVENOX) injection  40 mg Subcutaneous Q24H  . famotidine  20 mg Oral Q12H  . furosemide  40 mg Oral Daily  . guaiFENesin  1,200 mg Oral BID  . haloperidol lactate  2.5 mg Intravenous Once  . insulin aspart  0-15 Units Subcutaneous TID WC  . insulin detemir  15 Units Subcutaneous BID  . ipratropium-albuterol  3 mL Nebulization TID  . magnesium oxide  400 mg Oral Q12H  . mouth rinse  15 mL Mouth Rinse q12n4p  . [START ON 10/15/2017] methylPREDNISolone (SOLU-MEDROL) injection  30 mg Intravenous Daily  . metoprolol succinate  50 mg Oral Daily  . mirtazapine  15 mg Oral QHS  . mupirocin ointment  1 application Nasal BID  . pantoprazole  40 mg Oral Q12H    Objective: Vital signs in last 24 hours: Temp:  [97.4 F (36.3 C)-98.9 F (37.2 C)] 98.9 F (37.2 C) (07/01 1200) Pulse Rate:  [82-97] 93 (07/01 1100) Resp:  [14-22] 21 (07/01 1100) BP: (93-144)/(41-101) 125/71 (07/01 1100) SpO2:  [83 %-100 %] 92 % (07/01 1318) FiO2 (%):  [50 %] 50 % (07/01 0800) Weight:  [81.8 kg (180 lb 5.4 oz)] 81.8 kg (180 lb 5.4 oz) (07/01 0500)   General appearance: alert, cooperative and no distress Resp:  diminished breath sounds bilaterally and rhonchi bilaterally Cardio: regular rate and rhythm GI: normal findings: soft, non-tender and hypoactive bowel sounds.  Extremities: no edema in LE  Lab Results Recent Labs    10/13/17 0321 10/14/17 0330  WBC 8.5 7.9  HGB 10.4* 10.3*  HCT 35.6* 35.5*  NA 144 140  K 4.2 4.5  CL 98 96*  CO2 35* 35*  BUN 46* 51*  CREATININE 0.94 1.05*   Liver Panel No results for input(s): PROT, ALBUMIN, AST, ALT, ALKPHOS, BILITOT, BILIDIR, IBILI in the last 72 hours. Sedimentation Rate No results for input(s): ESRSEDRATE in the last 72 hours. C-Reactive Protein No results for input(s): CRP in the last 72 hours.  Microbiology: Recent Results (from the past 240 hour(s))  Blood culture (routine x 2)     Status: None (Preliminary result)   Collection Time:  10/10/17 12:08 PM  Result Value Ref Range Status   Specimen Description   Final    BLOOD RIGHT ARM Performed at [facility], [address] [place], [place] [address]    Special Requests   Final    BOTTLES DRAWN AEROBIC AND ANAEROBIC Blood Culture adequate volume Performed at [facility] [address], [place], [place] [address]    Culture   Final    NO GROWTH 4 DAYS Performed at [facility], [place] [address] [place], [place] [address]    Report Status PENDING  Incomplete  Blood culture (routine x 2)     Status: Abnormal   Collection Time: 10/10/17 12:13 PM  Result Value Ref Range Status   Specimen Description   Final    BLOOD LEFT ARM Performed at [facility], [place] [address] [place], [place] [address]    Special Requests   Final    BOTTLES DRAWN AEROBIC AND ANAEROBIC Blood Culture adequate volume Performed at [facility] [address], [place], [place] [address]    Culture  Setup Time   Final    AEROBIC BOTTLE ONLY GRAM POSITIVE COCCI CRITICAL RESULT CALLED TO, READ BACK BY AND VERIFIED WITH: Colin Rhein [facility] 10/11/17 4982 JDW Performed at [facility], [address], [place],  [address]    Culture STREPTOCOCCUS PNEUMONIAE (A)  Final   Report Status 10/13/2017 FINAL  Final   Organism ID, Bacteria STREPTOCOCCUS PNEUMONIAE  Final      Susceptibility   Streptococcus pneumoniae - MIC*    ERYTHROMYCIN <=0.12 SENSITIVE Sensitive     LEVOFLOXACIN 0.5 SENSITIVE Sensitive     PENICILLIN (meningitis) <=0.06 SENSITIVE Sensitive     PENICILLIN (non-meningitis) <=0.06 SENSITIVE Sensitive     CEFTRIAXONE (non-meningitis) <=0.12 SENSITIVE Sensitive     CEFTRIAXONE (meningitis) <=0.12 SENSITIVE Sensitive     * STREPTOCOCCUS PNEUMONIAE  Blood Culture ID Panel (Reflexed)     Status: Abnormal   Collection Time: 10/10/17 12:13 PM  Result Value Ref Range Status   Enterococcus species NOT DETECTED NOT DETECTED Final     Listeria monocytogenes NOT DETECTED NOT DETECTED Final   Staphylococcus species NOT DETECTED NOT DETECTED Final   Staphylococcus aureus NOT DETECTED NOT DETECTED Final   Streptococcus species DETECTED (A) NOT DETECTED Final    Comment: CRITICAL RESULT CALLED TO, READ BACK BY AND VERIFIED WITH: M BELL PHARMD 10/11/17 3094 JDW    Streptococcus agalactiae NOT DETECTED NOT DETECTED Final   Streptococcus pneumoniae DETECTED (A) NOT DETECTED Final    Comment: CRITICAL RESULT CALLED TO, READ BACK  BY AND VERIFIED WITH: Colin Rhein [facility] 10/11/17 3500 JDW    Streptococcus pyogenes NOT DETECTED NOT DETECTED Final   Acinetobacter baumannii NOT DETECTED NOT DETECTED Final   Enterobacteriaceae species NOT DETECTED NOT DETECTED Final   Enterobacter cloacae complex NOT DETECTED NOT DETECTED Final   Escherichia coli NOT DETECTED NOT DETECTED Final   Klebsiella oxytoca NOT DETECTED NOT DETECTED Final   Klebsiella pneumoniae NOT DETECTED NOT DETECTED Final   Proteus species NOT DETECTED NOT DETECTED Final   Serratia marcescens NOT DETECTED NOT DETECTED Final   Haemophilus influenzae NOT DETECTED NOT DETECTED Final   Neisseria meningitidis NOT DETECTED NOT DETECTED Final   Pseudomonas aeruginosa NOT DETECTED NOT DETECTED Final   Candida albicans NOT DETECTED NOT DETECTED Final   Candida glabrata NOT DETECTED NOT DETECTED Final   Candida krusei NOT DETECTED NOT DETECTED Final   Candida parapsilosis NOT DETECTED NOT DETECTED Final   Candida tropicalis NOT DETECTED NOT DETECTED Final    Comment: Performed at [facility], [place] [address] [place], [place] [address]  MRSA PCR Screening     Status: Abnormal   Collection Time: 10/11/17  7:54 AM  Result Value Ref Range Status   MRSA by PCR POSITIVE (A) NEGATIVE Final    Comment:        The GeneXpert MRSA Assay (FDA approved for NASAL specimens only), is one component of a comprehensive MRSA colonization surveillance program. It is not intended to  diagnose MRSA infection nor to guide or monitor treatment for MRSA infections. RESULT CALLED TO, READ BACK BY AND VERIFIED WITH: DUONG,T. RN _0  ON 06.28.19 BY COHEN,K Performed at [facility], [place] [address] [place], [place] [address]   Culture, blood (Routine X 2) w Reflex to ID Panel     Status: None (Preliminary result)   Collection Time: 10/13/17  3:45 PM  Result Value Ref Range Status   Specimen Description   Final    BLOOD LEFT ARM Performed at [facility] [address], [place], [place] [address]    Special Requests   Final    BOTTLES DRAWN AEROBIC AND ANAEROBIC Blood Culture adequate volume Performed at [facility] [address], [place], [place] [address]    Culture   Final    NO GROWTH < 24 HOURS Performed at [facility] [address] [place], [place] [address]    Report Status PENDING  Incomplete  Culture, blood (Routine X 2) w Reflex to ID Panel     Status: None (Preliminary result)   Collection Time: 10/13/17  3:52 PM  Result Value Ref Range Status   Specimen Description   Final    BLOOD RIGHT HAND Performed at [facility] [address], [place], [place] [address]    Special Requests   Final    BOTTLES DRAWN AEROBIC ONLY Blood Culture adequate volume Performed at [facility] [address] [place], [place] [address]    Culture   Final    NO GROWTH < 24 HOURS Performed at [facility] [address], [place],  [address]    Report Status PENDING  Incomplete    Studies/Results: No results found.   Assessment/Plan: Pneumococcal bacteremia Pneumonia COPD CHF  Total days of antibiotics: 5 (ceftriaxone)  Her repeat BCx are negative to date Remains on O2- NRB.  She's afebrile.  Could stop her anbx at day 10  I would continue her on  ceftriaxone.  Available as needed.          Bobby Rumpf MD, FACP Infectious  Diseases (pager) [phone] [email or web address] 10/14/2017, 2:18 PM  LOS: 4 days

## 2017-10-14 NOTE — Progress Notes (Signed)
[facility]  Patient Status: New  [facility] is providing the following services: RN, PT and OT  If patient discharges after hours, please call [phone].   Vanessa Alexander 10/14/2017, 9:31 AM

## 2017-10-14 NOTE — Progress Notes (Signed)
PULMONARY / CRITICAL CARE MEDICINE   Name: Vanessa Alexander MRN: [id number] DOB: 05-29-1955    ADMISSION DATE:  10/10/2017 CONSULTATION DATE:  10/12/2017  REFERRING MD:  Grandville Silos  CHIEF COMPLAINT:  Dyspnea  HISTORY OF PRESENT ILLNESS:   62 y/o female with severe COPD on 6L [place] who has experienced multiple hospitalizations for her lung problems was admitted on 6/27 with acute on chronic respiratory failure with hypoxemia due to strep pneumonia.    PAST MEDICAL HISTORY :  She  has a past medical history of CHF (congestive heart failure) ([place]), COPD (chronic obstructive pulmonary disease) ([place]), Cor pulmonale (chronic) ([place]), Diabetes mellitus without complication ([place]), Hypertension, Paroxysmal atrial fibrillation ([place]), and Pulmonary hypertension ([place]).   SUBJECTIVE:  Remains on BIPAP most of day  VITAL SIGNS: BP 119/90   Pulse 97   Temp (!) 97.4 F (36.3 C) (Axillary)   Resp 19   Ht 4' 11" (1.499 m)   Wt 180 lb 5.4 oz (81.8 kg)   SpO2 92%   BMI 36.42 kg/m   HEMODYNAMICS:    VENTILATOR SETTINGS: FiO2 (%):  [50 %] 50 %  INTAKE / OUTPUT: I/O last 3 completed shifts: In: 480 [P.O.:480] Out: 300 [Urine:300]  PHYSICAL EXAMINATION:  General:  Resting comfortably in bed HENT: NCAT BIPAP mask in place PULM: Poor air movement R>L, no wheezing, normal effort CV: RRR, no mgr GI: BS+, soft, nontender MSK: normal bulk and tone Neuro: awake, alert, no distress, MAEW   LABS:  BMET Recent Labs  Lab 10/12/17 0256 10/13/17 0321 10/14/17 0330  NA 147* 144 140  K 3.8 4.2 4.5  CL 99 98 96*  CO2 37* 35* 35*  BUN 47* 46* 51*  CREATININE 1.11* 0.94 1.05*  GLUCOSE 212* 281* 347*    Electrolytes Recent Labs  Lab 10/12/17 0256 10/13/17 0321 10/14/17 0330  CALCIUM 8.5* 8.7* 8.6*  MG  --   --  2.5*  PHOS  --   --  3.1    CBC Recent Labs  Lab 10/12/17 0256 10/13/17 0321 10/14/17 0330  WBC 13.1* 8.5 7.9  HGB 9.8* 10.4* 10.3*  HCT 32.3* 35.6* 35.5*  PLT 200 189  203    Coag's No results for input(s): APTT, INR in the last 168 hours.  Sepsis Markers Recent Labs  Lab 10/10/17 1254  10/11/17 0804 10/12/17 0256 10/13/17 0321  LATICACIDVEN 2.92*  --  1.2  --   --   PROCALCITON  --    < > 2.75 1.81 0.68   < > = values in this interval not displayed.    ABG Recent Labs  Lab 10/10/17 1142 10/10/17 1807  PHART 7.389 7.374  PCO2ART 56.9* 55.8*  PO2ART 68.3* 63.7*    Liver Enzymes Recent Labs  Lab 10/10/17 1134 10/11/17 0311  AST 27 15  ALT 38 34  ALKPHOS 107 90  BILITOT 1.1 0.7  ALBUMIN 3.1* 2.7*    Cardiac Enzymes Recent Labs  Lab 10/10/17 1546 10/10/17 2225 10/11/17 0311  TROPONINI 0.10* 0.10* 0.08*    Glucose Recent Labs  Lab 10/12/17 2122 10/13/17 0735 10/13/17 1209 10/13/17 1646 10/13/17 2125 10/14/17 0739  GLUCAP 314* 228* 289* 153* 255* 295*    Imaging No results found.   STUDIES:  06/2017 CT chest showed emphysema worse in upper lobes, cardiomegally, pulmonary vascular enlargement 09/2017 CXR images showing emphysema, cardiomegally, pulmonary vascular enlargement, bibasilar infiltrates  CULTURES: 6/27 blood > strep pneumo 6/30 blood > negative  ANTIBIOTICS: 6/27 vanc > x1 6/27 cefepime >  x1 6/28 ceftriaxone >   SIGNIFICANT EVENTS:   LINES/TUBES:   DISCUSSION: 62 y/o female with advanced chronic respiratory failure with hypercarbia and hypoxemia due to emphysema and CHF admitted for another acute exacerbation of respiratory failure due to strep pneumo pneumonia.    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 TGrandville Silos My cc time 30 minutes  BRoselie Awkward MD [facility] Pager: [phone]: [phone] 3pm or if no response, call [phone]  10/14/2017, 9:08 AM

## 2017-10-14 NOTE — Progress Notes (Signed)
PROGRESS NOTE    Vanessa Alexander  DBZ:[id number] DOB: [birth year] DOA: 10/10/2017 PCP: Townsend Roger, MD    Brief Narrative:  Vanessa Alexander is a 62 y.o. 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  Patient open BiPAP during my interview, however wake up and answer questions.  Patient was hospitalized 1 month ago at [facility] due to COPD exacerbation and pneumonia and was discharged to SNF for short-term rehab.  She just left the facility 1 day ago.  Patient reported that she was having difficulty breathing and worsening of her baseline shortness of breath for the past week or so and apparently this was not addressed.  Patient was unable to walk around her house with her baseline 6 L nasal cannula, developing cough and generalized fatigue.  Patient denies chest pain, palpitations and dizziness.  ED Course: EMS found patient with oxygen saturation in the low 80s, gave her steroids and placed on BiPAP.  Labs remarkable for WBC of 22, chest x-ray with increasing vascular congestion, and bibasilar opacities.  Elevated creatinine at 1.7 and mild elevated lactic acid at 2.9.  Patient was given IV fluids and antibiotics by EDP.      Assessment & Plan:   Principal Problem:   Pneumococcal bacteremia Active Problems:   Acute on chronic respiratory failure (HCC)   HCAP (healthcare-associated pneumonia)   Acute on chronic diastolic CHF (congestive heart failure) (HCC)   COPD (chronic obstructive pulmonary disease) (HCC)   HTN (hypertension)   AKI (acute kidney injury) (HCC)   DM (diabetes mellitus) (HCC)   Lactic acidosis   Elevated troponin   Hyperkalemia   AF (paroxysmal atrial fibrillation) (HCC)  #1 acute on chronic respiratory failure with hypoxia and hypercarbia Likely multifactorial secondary to HCAP, acute COPD  exacerbation( on 6 L nasal cannula baseline), acute on chronic diastolic CHF exacerbation.  Patient noted on admission to have lower extremity edema, with hypoxia and hypercarbia.  Chest x-ray which was done showed bibasilar infiltrates left greater than right.  Patient placed on the BiPAP and had been on the BiPAP overnight.  Patient with some clinical improvement.  Patient was transitioned to nonrebreather and feeling better than she did on 10/12/2017.  Patient states she is on BiPAP nightly.   Chest x-ray on admission was concerning for volume overload BNP was elevated at 726 with mildly elevated troponin.  Patient placed on IV Lasix, empiric IV vancomycin IV cefepime.  Blood cultures positive for strep pneumococcus.  Patient with a urine output not accurately recorded.  Currently on BiPAP and patient was on BiPAP intermittently the afternoon of 10/13/2017..  IV vancomycin and IV cefepime have been transitioned to IV Rocephin.  Could likely transition to oral amoxicillin versus Augmentin tomorrow 10/15/2017.  Place on high flow nasal cannula and keep sats 88 to 93%.  Repeat chest x-ray stable with left retrocardiac opacity and unchanged. Continue Brovana, Pulmicort, Mucinex, scheduled duo nebs, IV Solu-Medrol taper, PPI.  Start home dose oral Lasix 40 mg daily.  PCCM following and I appreciate the input and recommendations..    2.  Gram-positive bacteremia secondary to Streptococcus pneumonia Preliminary blood cultures with gram-positive bacteremia with BCID showing Streptococcus pneumoniae.  Patient presented with acute on chronic respiratory failure with concerns for healthcare associated pneumonia.  Discontinued IV vancomycin and IV cefepime.  Continue IV Rocephin.  Blood cultures  repeated on 10/13/2017 pending.   Patient has been seen in consultation by ID who recommend continuing IV Rocephin while susceptibilities are pending and recommending a total of 10 days of antibiotic therapy at this time.  Would  likely transition to oral amoxicillin versus Augmentin tomorrow 10/15/2017.  Appreciate ID input and recommendations.    3.  Acute on chronic diastolic heart failure Patient presented with worsening shortness of breath, chest x-ray worrisome for fluid overload, BNP elevated at 726 with mildly elevated troponin.  Patient also noted to have some lower extremity edema.  Patient with recent 2D echo done 07/25/2017 with normal systolic function, mild diastolic dysfunction, moderate LVH, D-shaped septum, severe dilated right atrium, increased right atrial pressure with a PA peak pressure of 58 mmHg.  Patient placed on IV Lasix with urine output not correctly recorded.  Current weight of 180 pounds from 180 pounds from 172 pounds from 179 pounds on 10/11/2017 from 176 pounds on 10/10/2017.  IV Lasix was discontinued 10/12/2017.  Patient to receive Lasix 40 mg IV x1 on 10/13/2017.  Resume home dose Lasix 40 mg daily today 10/14/2017.  Strict I's and O's.  Daily weights.   4. HCAP Patient noted to have recently been discharged from [facility] due to COPD and pneumonia was at a skilled nursing facility.  Patient presented with cough, leukocytosis.  Blood cultures positive for Streptococcus pneumonia.  Urine Legionella antigen positive.  Urine pneumococcus antigen negative.  Urine Legionella antigen pending.  MRSA PCR is positive.  Repeat procalcitonin level and lactic acid level which were elevated on admission are trending down..  Continue supportive treatment.  Discontinued IV vancomycin and IV cefepime and transitioned to IV Rocephin as blood cultures positive for Streptococcus pneumonia.  BiPAP QHS.  Likely transition to oral amoxicillin versus Augmentin tomorrow 10/15/2017 to complete a 10 to 14-day course of antibiotic treatment.  Supportive care.  5.  Acute COPD exacerbation/chronic respiratory failure on 6 L nasal cannula home O2 No wheezing noted on examination.  Patient however with poor-fair air movement on  10/12/2017 with worsening shortness of breath.  Patient also being treated for pneumonia.  Patient placed on IV steroid taper.  Continue current nebulizer treatments, Pulmicort, Brovana, duo nebs, Mucinex, empiric IV Rocephin.  Repeat chest x-ray with left retrocardiac opacity unchanged.  Keep sats between 88 to 93%.  BiPAP nightly. PCCM ff.  6.  Acute kidney injury Likely cardiorenal.  Renal function improving with diuresis.  IV Lasix was held on 10/12/2017 secondary to hyponatremia.  Patient received a dose of IV Lasix on 10/13/2017.  Resume home dose Lasix 40 mg daily.    7.  Hyperkalemia Status post Kayexalate.  Hyperkalemia resolved.  Follow.    8.  Lactic acidosis Likely secondary to problem #1.  Continue current treatment as in problem #1.  Repeat lactic acid level with improvement.  Procalcitonin level trending down.    9.  Elevated troponin Likely secondary to demand ischemia from acute CHF exacerbation.  EKG with no ischemic changes noted.  Patient with recent 2D echo done and as such will not repeat.  Follow.  10.  Hypertension Stable.  Continue Cardizem, Lasix, Toprol-XL.  Follow.    11.  Type 2 diabetes mellitus Patient on full liquids.  Hemoglobin A1c was 7.2 on 08/01/2017.  CBGs 295 this morning.  Patient on IV steroids and likely because of increased CBGs.  Increase Levemir back to 15 units twice daily.  Continue sliding scale insulin.  12.  SVT versus MAT/tachycardia  vs hx of paroxysmal A. fib Likely secondary to acute pulmonary issues.  Continue home dose Cardizem and increased dose Toprol-XL 50 mg daily.  IV Lopressor as needed.Patient states had a history of recent bleed and as such anticoagulation was discontinued.  We will hold off on anticoagulation at this time.     DVT prophylaxis: Lovenox Code Status: Full Family Communication: Updated patient.  No family at bedside. Disposition Plan: Remain in stepdown unit.  To be determined.   Consultants:   ID: Dr. Megan Salon  10/11/2017  PCCM: Dr. Nelda Marseille 10/12/2017  Procedures:   Chest x-ray 10/10/2017, 10/11/2017, 10/12/2017    Antimicrobials:   IV vancomycin 10/10/2017>>>>> 10/11/2017  IV cefepime 10/10/2017>>>>> 10/11/2017  IV Rocephin 10/11/2017   Subjective: Patient on BiPAP.  Sleeping.  It is noted that patient was on BiPAP intermittently in the afternoon yesterday.  Patient feels breathing is slowly improving.  No chest pain.    Objective: Vitals:   10/14/17 0600 10/14/17 0725 10/14/17 0735 10/14/17 0800  BP: 121/85   119/90  Pulse:  86  97  Resp: _0 Temp:   (!) 97.4 F (36.3 C)   TempSrc:   Axillary   SpO2: 100% 92%  92%  Weight:      Height:        Intake/Output Summary (Last 24 hours) at 10/14/2017 0901 Last data filed at 10/14/2017 0600 Gross per 24 hour  Intake -  Output 300 ml  Net -300 ml   Filed Weights   10/12/17 0400 10/13/17 0400 10/14/17 0500  Weight: 78.3 kg (172 lb 9.9 oz) 81.8 kg (180 lb 5.4 oz) 81.8 kg (180 lb 5.4 oz)    Examination:  General exam: On bipap.   Respiratory system: Decreased BS in bases, with some coarse BS/crackles in left base. No wheezing.  Poor air movement.   Cardiovascular system: Irregularly irregular.  No JVD.  No murmurs rubs or gallops.  1+ bilateral lower extremity edema.   Gastrointestinal system: Abdomen is soft, nontender, nondistended, positive bowel sounds.  No rebound.  No guarding. Central nervous system: Alert and oriented. No focal neurological deficits. Extremities: Symmetric 5 x 5 power. Skin: No rashes, lesions or ulcers Psychiatry: Judgement and insight appear normal. Mood & affect appropriate.     Data Reviewed: I have personally reviewed following labs and imaging studies  CBC: Recent Labs  Lab 10/10/17 1134 10/11/17 0311 10/12/17 0256 10/13/17 0321 10/14/17 0330  WBC 22.4* 18.2* 13.1* 8.5 7.9  NEUTROABS 20.6*  --  12.3* 8.0*  --   HGB 10.8* 10.0* 9.8* 10.4* 10.3*  HCT 36.3 33.5* 32.3* 35.6* 35.5*  MCV 95.3  95.2 95.0 97.0 97.8  PLT 181 185 200 189 496   Basic Metabolic Panel: Recent Labs  Lab 10/11/17 0311 10/11/17 1136 10/12/17 0256 10/13/17 0321 10/14/17 0330  NA 144 145 147* 144 140  K 5.4* 4.2 3.8 4.2 4.5  CL 99 99 99 98 96*  CO2 34* 31 37* 35* 35*  GLUCOSE 199* 187* 212* 281* 347*  BUN 45* 45* 47* 46* 51*  CREATININE 1.13* 1.05* 1.11* 0.94 1.05*  CALCIUM 8.6* 8.8* 8.5* 8.7* 8.6*  MG  --   --   --   --  2.5*  PHOS  --   --   --   --  3.1   GFR: Estimated Creatinine Clearance: 50.1 mL/min (A) (by C-G formula based on SCr of 1.05 mg/dL (H)). Liver Function Tests: Recent Labs  Lab 10/10/17 1134  10/11/17 0311  AST 27 15  ALT 38 34  ALKPHOS 107 90  BILITOT 1.1 0.7  PROT 6.8 6.2*  ALBUMIN 3.1* 2.7*   No results for input(s): LIPASE, AMYLASE in the last 168 hours. No results for input(s): AMMONIA in the last 168 hours. Coagulation Profile: No results for input(s): INR, PROTIME in the last 168 hours. Cardiac Enzymes: Recent Labs  Lab 10/10/17 1134 10/10/17 1546 10/10/17 2225 10/11/17 0311  TROPONINI 0.10* 0.10* 0.10* 0.08*   BNP (last 3 results) No results for input(s): PROBNP in the last 8760 hours. HbA1C: No results for input(s): HGBA1C in the last 72 hours. CBG: Recent Labs  Lab 10/13/17 0735 10/13/17 1209 10/13/17 1646 10/13/17 2125 10/14/17 0739  GLUCAP 228* 289* 153* 255* 295*   Lipid Profile: No results for input(s): CHOL, HDL, LDLCALC, TRIG, CHOLHDL, LDLDIRECT in the last 72 hours. Thyroid Function Tests: No results for input(s): TSH, T4TOTAL, FREET4, T3FREE, THYROIDAB in the last 72 hours. Anemia Panel: No results for input(s): VITAMINB12, FOLATE, FERRITIN, TIBC, IRON, RETICCTPCT in the last 72 hours. Sepsis Labs: Recent Labs  Lab 10/10/17 1254 10/10/17 1546 10/11/17 0804 10/12/17 0256 10/13/17 0321  PROCALCITON  --  2.25 2.75 1.81 0.68  LATICACIDVEN 2.92*  --  1.2  --   --     Recent Results (from the past 240 hour(s))  Blood  culture (routine x 2)     Status: None (Preliminary result)   Collection Time: 10/10/17 12:08 PM  Result Value Ref Range Status   Specimen Description   Final    BLOOD RIGHT ARM Performed at [facility] [address], [place], [place] [address]    Special Requests   Final    BOTTLES DRAWN AEROBIC AND ANAEROBIC Blood Culture adequate volume Performed at [facility] [address], [place], [place] [address]    Culture   Final    NO GROWTH 3 DAYS Performed at [facility], [place] [address], [place], [place] [address]    Report Status PENDING  Incomplete  Blood culture (routine x 2)     Status: Abnormal   Collection Time: 10/10/17 12:13 PM  Result Value Ref Range Status   Specimen Description   Final    BLOOD LEFT ARM Performed at [facility], [place] [address], [place], [place] [address]    Special Requests   Final    BOTTLES DRAWN AEROBIC AND ANAEROBIC Blood Culture adequate volume Performed at [facility] [address] [place], [place] [address]    Culture  Setup Time   Final    AEROBIC BOTTLE ONLY GRAM POSITIVE COCCI CRITICAL RESULT CALLED TO, READ BACK BY AND VERIFIED WITH: Colin Rhein [facility] 10/11/17 3295 JDW Performed at [facility], [address], [place], [place] [address]    Culture STREPTOCOCCUS PNEUMONIAE (A)  Final   Report Status 10/13/2017 FINAL  Final   Organism ID, Bacteria STREPTOCOCCUS PNEUMONIAE  Final      Susceptibility   Streptococcus pneumoniae - MIC*    ERYTHROMYCIN <=0.12 SENSITIVE Sensitive     LEVOFLOXACIN 0.5 SENSITIVE Sensitive     PENICILLIN (meningitis) <=0.06 SENSITIVE Sensitive     PENICILLIN (non-meningitis) <=0.06 SENSITIVE Sensitive     CEFTRIAXONE (non-meningitis) <=0.12 SENSITIVE Sensitive     CEFTRIAXONE (meningitis) <=0.12 SENSITIVE Sensitive     * STREPTOCOCCUS PNEUMONIAE  Blood Culture ID Panel (Reflexed)     Status: Abnormal   Collection Time: 10/10/17 12:13  PM  Result  Value Ref Range Status   Enterococcus species NOT DETECTED NOT DETECTED Final   Listeria monocytogenes NOT DETECTED NOT DETECTED Final   Staphylococcus species NOT DETECTED NOT DETECTED Final   Staphylococcus aureus NOT DETECTED NOT DETECTED Final   Streptococcus species DETECTED (A) NOT DETECTED Final    Comment: CRITICAL RESULT CALLED TO, READ BACK BY AND VERIFIED WITH: M BELL PHARMD 10/11/17 3419 JDW    Streptococcus agalactiae NOT DETECTED NOT DETECTED Final   Streptococcus pneumoniae DETECTED (A) NOT DETECTED Final    Comment: CRITICAL RESULT CALLED TO, READ BACK BY AND VERIFIED WITH: Jerilynn Mages BELL PHARMD 10/11/17 3790 JDW    Streptococcus pyogenes NOT DETECTED NOT DETECTED Final   Acinetobacter baumannii NOT DETECTED NOT DETECTED Final   Enterobacteriaceae species NOT DETECTED NOT DETECTED Final   Enterobacter cloacae complex NOT DETECTED NOT DETECTED Final   Escherichia coli NOT DETECTED NOT DETECTED Final   Klebsiella oxytoca NOT DETECTED NOT DETECTED Final   Klebsiella pneumoniae NOT DETECTED NOT DETECTED Final   Proteus species NOT DETECTED NOT DETECTED Final   Serratia marcescens NOT DETECTED NOT DETECTED Final   Haemophilus influenzae NOT DETECTED NOT DETECTED Final   Neisseria meningitidis NOT DETECTED NOT DETECTED Final   Pseudomonas aeruginosa NOT DETECTED NOT DETECTED Final   Candida albicans NOT DETECTED NOT DETECTED Final   Candida glabrata NOT DETECTED NOT DETECTED Final   Candida krusei NOT DETECTED NOT DETECTED Final   Candida parapsilosis NOT DETECTED NOT DETECTED Final   Candida tropicalis NOT DETECTED NOT DETECTED Final    Comment: Performed at [facility], [place] [address] [place], [place] [address]  MRSA PCR Screening     Status: Abnormal   Collection Time: 10/11/17  7:54 AM  Result Value Ref Range Status   MRSA by PCR POSITIVE (A) NEGATIVE Final    Comment:        The GeneXpert MRSA Assay (FDA approved for NASAL specimens only), is one component of  a comprehensive MRSA colonization surveillance program. It is not intended to diagnose MRSA infection nor to guide or monitor treatment for MRSA infections. RESULT CALLED TO, READ BACK BY AND VERIFIED WITH: DUONG,T. RN _0  ON 06.28.19 BY COHEN,K Performed at [facility], [place] [address] [place], [place] [address]          Radiology Studies: Dg Chest Port 1 View  Result Date: 10/12/2017 CLINICAL DATA:  Trouble breathing for 2 days. EXAM: PORTABLE CHEST 1 VIEW COMPARISON:  October 11, 2017 FINDINGS: Cardiomegaly. Left retrocardiac opacity, unchanged. No other interval changes identified. IMPRESSION: Left retrocardiac opacity, stable.  Cardiomegaly.  No other change. Electronically Signed   By: Dorise Bullion III M.D   On: 10/12/2017 11:19        Scheduled Meds: . arformoterol  15 mcg Nebulization BID  . atorvastatin  40 mg Oral Daily  . budesonide (PULMICORT) nebulizer solution  0.5 mg Nebulization BID  . busPIRone  10 mg Oral TID  . chlorhexidine  15 mL Mouth Rinse BID  . Chlorhexidine Gluconate Cloth  6 each Topical Q0600  . digoxin  0.125 mg Oral Daily  . diltiazem  180 mg Oral Daily  . enoxaparin (LOVENOX) injection  40 mg Subcutaneous Q24H  . famotidine  20 mg Oral Q12H  . furosemide  40 mg Oral Daily  . guaiFENesin  1,200 mg Oral BID  . haloperidol lactate  2.5 mg Intravenous Once  . insulin aspart  0-15 Units Subcutaneous TID  WC  . insulin detemir  15 Units Subcutaneous BID  . ipratropium-albuterol  3 mL Nebulization TID  . magnesium oxide  400 mg Oral Q12H  . mouth rinse  15 mL Mouth Rinse q12n4p  . methylPREDNISolone (SOLU-MEDROL) injection  60 mg Intravenous Q12H  . metoprolol succinate  50 mg Oral Daily  . mirtazapine  15 mg Oral QHS  . mupirocin ointment  1 application Nasal BID  . pantoprazole  40 mg Oral Q12H   Continuous Infusions: . cefTRIAXone (ROCEPHIN)  IV Stopped (10/13/17 1059)     LOS: 4 days    Time spent: 40  minutes    Irine Seal, MD [facility] Pager [phone]  If 7PM-7AM, please contact night-coverage [email or web address] Password [email or web address] 10/14/2017, 9:01 AM

## 2017-10-15 LAB — CULTURE, BLOOD (ROUTINE X 2)
Culture: NO GROWTH
Special Requests: ADEQUATE

## 2017-10-15 LAB — CBC
HCT: 36.5 % (ref 36.0–46.0)
Hemoglobin: 10.5 g/dL — ABNORMAL LOW (ref 12.0–15.0)
MCH: 28.2 pg (ref 26.0–34.0)
MCHC: 28.8 g/dL — AB (ref 30.0–36.0)
MCV: 97.9 fL (ref 78.0–100.0)
PLATELETS: 175 10*3/uL (ref 150–400)
RBC: 3.73 MIL/uL — ABNORMAL LOW (ref 3.87–5.11)
RDW: 16.2 % — AB (ref 11.5–15.5)
WBC: 10.3 10*3/uL (ref 4.0–10.5)

## 2017-10-15 LAB — GLUCOSE, CAPILLARY
Glucose-Capillary: 312 mg/dL — ABNORMAL HIGH (ref 70–99)
Glucose-Capillary: 288 mg/dL — ABNORMAL HIGH (ref 70–99)
Glucose-Capillary: 205 mg/dL — ABNORMAL HIGH (ref 70–99)
Glucose-Capillary: 433 mg/dL — ABNORMAL HIGH (ref 70–99)

## 2017-10-15 LAB — BASIC METABOLIC PANEL
ANION GAP: 9 (ref 5–15)
BUN: 59 mg/dL — ABNORMAL HIGH (ref 8–23)
CALCIUM: 8.8 mg/dL — AB (ref 8.9–10.3)
CO2: 35 mmol/L — ABNORMAL HIGH (ref 22–32)
Chloride: 99 mmol/L (ref 98–111)
Creatinine, Ser: 1.39 mg/dL — ABNORMAL HIGH (ref 0.44–1.00)
GFR, EST AFRICAN AMERICAN: 45 mL/min — AB (ref >60)
GFR, EST NON AFRICAN AMERICAN: 39 mL/min — AB (ref >60)
Glucose, Bld: 320 mg/dL — ABNORMAL HIGH (ref 70–99)
Potassium: 4.4 mmol/L (ref 3.5–5.1)
SODIUM: 143 mmol/L (ref 135–145)

## 2017-10-15 MED ORDER — PREDNISONE 20 MG PO TABS
20.0000 mg | ORAL_TABLET | Freq: Every day | ORAL | Status: AC
  Administered 2017-10-16 – 2017-10-18 (×3): 20 mg via ORAL
  Filled 2017-10-15 (×3): qty 1

## 2017-10-15 NOTE — Progress Notes (Signed)
Pt educated about diabetic meal choices and insulin coverage. Pt agrees with this nurse and communicates understanding. Pt's family then brings in [place] prior to her ordered dinner and pt asking for additional sides. Will continue to monitor blood sugars and educate.

## 2017-10-15 NOTE — Progress Notes (Addendum)
Inpatient Diabetes Program Recommendations  AACE/ADA: New Consensus Statement on Inpatient Glycemic Control (2015)  Target Ranges:  Prepandial:   less than 140 mg/dL      Peak postprandial:   less than 180 mg/dL (1-2 hours)      Critically ill patients:  140 - 180 mg/dL   Results for KEERAT, DENICOLA (MRN [id number]) as of 10/15/2017 12:22  Ref. Range 10/14/2017 07:39 10/14/2017 12:14 10/14/2017 16:14 10/14/2017 21:07  Glucose-Capillary Latest Ref Range: 70 - 99 mg/dL 295 (H)  8 units NOVOLOG +  15 units LEVEMIR  337 (H)  11 units NOVOLOG  345 (H)  11 units NOVOLOG  191 (H)     20 units LEVEMIR   Results for MARCEDES, TECH (MRN [id number]) as of 10/15/2017 12:22  Ref. Range 10/15/2017 07:49 10/15/2017 12:00  Glucose-Capillary Latest Ref Range: 70 - 99 mg/dL 312 (H)  11 units NOVOLOG +  20 units LEVEMIR 288 (H)    Home DM Meds: Levemir 12 units BID       Humalog 2-10 units TID       Metformin 500 mg BID  Current Insulin Orders: Levemir 20 units BID      Novolog Moderate Correction Scale/ SSI (0-15 units) TID AC       Note Solumedrol reduced to 30 mg daily (was getting 60 mg BID yesterday).  Levemir increased to 20 units BID last PM.    Patient got total of 35 units Levemir yesterday--Will get total of 40 units Levemir today.     MD- If patient eating well, may also consider starting Novolog Meal Coverage as well:  Novolog 6 units TID with meals   (Please add the following Hold Parameters: Hold if pt eats <50% of meal, Hold if pt NPO)    --Will follow patient during hospitalization--  Wyn Quaker RN, MSN, CDE Diabetes Coordinator Inpatient Glycemic Control Team Team Pager: [phone] (8a-5p)

## 2017-10-15 NOTE — Progress Notes (Signed)
PROGRESS NOTE    Vanessa Alexander  KDX:[id number] DOB: 1956-03-16 DOA: 10/10/2017 PCP: Townsend Roger, MD    Brief Narrative:  Vanessa Alexander is a 62 y.o. 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  Patient open BiPAP during my interview, however wake up and answer questions.  Patient was hospitalized 1 month ago at [facility] due to COPD exacerbation and pneumonia and was discharged to SNF for short-term rehab.  She just left the facility 1 day ago.  Patient reported that she was having difficulty breathing and worsening of her baseline shortness of breath for the past week or so and apparently this was not addressed.  Patient was unable to walk around her house with her baseline 6 L nasal cannula, developing cough and generalized fatigue.  Patient denies chest pain, palpitations and dizziness.  ED Course: EMS found patient with oxygen saturation in the low 80s, gave her steroids and placed on BiPAP.  Labs remarkable for WBC of 22, chest x-ray with increasing vascular congestion, and bibasilar opacities.  Elevated creatinine at 1.7 and mild elevated lactic acid at 2.9.  Patient was given IV fluids and antibiotics by EDP.      Assessment & Plan:   Principal Problem:   Pneumococcal bacteremia Active Problems:   Acute on chronic respiratory failure (HCC)   HCAP (healthcare-associated pneumonia)   Acute on chronic diastolic CHF (congestive heart failure) (HCC)   COPD (chronic obstructive pulmonary disease) (HCC)   HTN (hypertension)   AKI (acute kidney injury) (HCC)   DM (diabetes mellitus) (HCC)   Lactic acidosis   Elevated troponin   Hyperkalemia   AF (paroxysmal atrial fibrillation) (HCC)   SOB (shortness of breath)  #1 acute on chronic respiratory failure with hypoxia and hypercarbia Likely multifactorial  secondary to HCAP, acute COPD exacerbation( on 6 L nasal cannula baseline), acute on chronic diastolic CHF exacerbation.  Patient noted on admission to have lower extremity edema, with hypoxia and hypercarbia.  Chest x-ray which was done showed bibasilar infiltrates left greater than right.  Patient placed on the BiPAP and had been on the BiPAP overnight.  Patient with some clinical improvement.  Patient was transitioned to nonrebreather and feeling better than she did on 10/12/2017.  Patient states she is on BiPAP nightly.   Chest x-ray on admission was concerning for volume overload BNP was elevated at 726 with mildly elevated troponin.  Patient placed on IV Lasix, empiric IV vancomycin IV cefepime.  Blood cultures positive for strep pneumococcus.  Patient with a urine output not accurately recorded.  Currently on BiPAP and patient was on BiPAP intermittently the afternoon of 10/13/2017..  IV vancomycin and IV cefepime have been transitioned to IV Rocephin.  Continue IV Rocephin per ID recommendations.  Continue on high flow nasal cannula and keep sats 88 to 93%.  Repeat chest x-ray stable with left retrocardiac opacity and unchanged. Continue Brovana, Pulmicort, Mucinex, scheduled duo nebs, IV Solu-Medrol taper, PPI.  Continue home dose Lasix 40 mg daily.  PCCM following and I appreciate the input and recommendations..    2.  Gram-positive bacteremia secondary to Streptococcus pneumonia Preliminary blood cultures with gram-positive bacteremia with BCID showing Streptococcus pneumoniae.  Patient presented with acute on chronic respiratory failure with concerns for healthcare associated pneumonia.  Discontinued IV vancomycin and IV cefepime.  Continue IV Rocephin.  Blood  cultures repeated on 10/13/2017 pending.   Patient has been seen in consultation by ID who recommend continuing IV Rocephin and recommending a total of 10 days of antibiotic therapy at this time. Appreciate ID input and recommendations.    3.   Acute on chronic diastolic heart failure Patient presented with worsening shortness of breath, chest x-ray worrisome for fluid overload, BNP elevated at 726 with mildly elevated troponin.  Patient also noted to have some lower extremity edema.  Patient with recent 2D echo done 07/25/2017 with normal systolic function, mild diastolic dysfunction, moderate LVH, D-shaped septum, severe dilated right atrium, increased right atrial pressure with a PA peak pressure of 58 mmHg.  Patient placed on IV Lasix with urine output not correctly recorded.  Current weight of 180 pounds from 180 pounds from 180 pounds from 172 pounds from 179 pounds on 10/11/2017 from 176 pounds on 10/10/2017.  IV Lasix was discontinued 10/12/2017.  Patient given Lasix 40 mg IV x1 on 10/13/2017.  Resumed home dose Lasix 40 mg daily today 10/14/2017.  Strict I's and O's.  Daily weights.   4. HCAP Patient noted to have recently been discharged from [facility] due to COPD and pneumonia was at a skilled nursing facility.  Patient presented with cough, leukocytosis.  Blood cultures positive for Streptococcus pneumonia.  Urine Legionella antigen positive.  Urine pneumococcus antigen negative.  Urine Legionella antigen pending.  MRSA PCR is positive.  Repeat procalcitonin level and lactic acid level which were elevated on admission are trending down..  Continue supportive treatment.  Discontinued IV vancomycin and IV cefepime and transitioned to IV Rocephin as blood cultures positive for Streptococcus pneumonia.  BiPAP QHS.  IV Rocephin to treat for total of 10 to 14-day course of antibiotic treatment.  Supportive care.   5.  Acute COPD exacerbation/chronic respiratory failure on 6 L nasal cannula home O2 No wheezing noted on examination.  Patient however with poor-fair air movement on 10/12/2017 with worsening shortness of breath.  Patient also being treated for pneumonia.  Patient placed on IV steroid taper.  Continue current nebulizer treatments,  Pulmicort, Brovana, duo nebs, Mucinex, empiric IV Rocephin.  Repeat chest x-ray with left retrocardiac opacity unchanged.  Keep sats between 88 to 93%.  He on intermittent BiPAP on for 2 hours off for 2 hours over the past 24 hours.  PCCM following.    6.  Acute kidney injury Likely cardiorenal.  Renal function was improving with diuresis.  Slight bump in creatinine. IV Lasix was held on 10/12/2017 secondary to hypernatremia.  Patient received a dose of IV Lasix on 10/13/2017.  Resumed home dose Lasix 40 mg daily.    7.  Hyperkalemia Resolved.  Status post Kayexalate.  Follow.   8.  Lactic acidosis Likely secondary to problem #1.  Continue current treatment as in problem #1.  Repeat lactic acid level with improvement.  Procalcitonin level trending down.    9.  Elevated troponin Likely secondary to demand ischemia from acute CHF exacerbation.  EKG with no ischemic changes noted.  Patient with recent 2D echo done and as such will not repeat.  Follow.  10.  Hypertension Continue current regimen of Cardizem, Lasix, Toprol-XL.   11.  Type 2 diabetes mellitus Patient on carb modified diet.  Hemoglobin A1c was 7.2 on 08/01/2017.  CBGs were elevated in the 300s yesterday.  Patient on IV steroids dose tapered down.  Levemir increased to 20 units twice daily.  Continue sliding scale insulin.    12.  SVT versus MAT/tachycardia vs hx of paroxysmal A. fib Likely secondary to acute pulmonary issues.  Continue home dose Cardizem and increased dose Toprol-XL 50 mg daily.  IV Lopressor as needed.Patient states had a history of recent bleed and as such anticoagulation was discontinued. Monitor.    DVT prophylaxis: Lovenox Code Status: Full Family Communication: Updated patient.  No family at bedside. Disposition Plan: Remain in stepdown unit.  To be determined.   Consultants:   ID: Dr. Megan Salon 10/11/2017  PCCM: Dr. Nelda Marseille 10/12/2017  Procedures:   Chest x-ray 10/10/2017, 10/11/2017,  10/12/2017    Antimicrobials:   IV vancomycin 10/10/2017>>>>> 10/11/2017  IV cefepime 10/10/2017>>>>> 10/11/2017  IV Rocephin 10/11/2017   Subjective: Patient on BiPAP in chair.  States she is feeling better.  Noted to have been placed intermittently on and off BiPAP yesterday.  Denies any chest pain.  Objective: Vitals:   10/15/17 0500 10/15/17 0600 10/15/17 0638 10/15/17 0806  BP: (!) 141/78 (!) 122/96    Pulse: 81 68  89  Resp: 20 16  (!) 22  Temp:    (!) 97.5 F (36.4 C)  TempSrc:    Axillary  SpO2: (!) 81% (!) 89%  93%  Weight:   82 kg (180 lb 12.4 oz)   Height:        Intake/Output Summary (Last 24 hours) at 10/15/2017 0909 Last data filed at 10/14/2017 1833 Gross per 24 hour  Intake 220 ml  Output 400 ml  Net -180 ml   Filed Weights   10/13/17 0400 10/14/17 0500 10/15/17 0638  Weight: 81.8 kg (180 lb 5.4 oz) 81.8 kg (180 lb 5.4 oz) 82 kg (180 lb 12.4 oz)    Examination:  General exam: On bipap in chair Respiratory system: Some bibasilar coarse breath sounds/crackles.  No wheezing noted poor to fair air movement.  No rhonchi. Cardiovascular system: Irregularly irregular.  No JVD.  No murmurs rubs or gallops.  1+ bilateral lower extremity edema.   Gastrointestinal system: Abdomen is nontender, nondistended, soft, positive bowel sounds.  No rebound.  No guarding.  Central nervous system: Alert and oriented. No focal neurological deficits. Extremities: Symmetric 5 x 5 power. Skin: No rashes, lesions or ulcers Psychiatry: Judgement and insight appear normal. Mood & affect appropriate.     Data Reviewed: I have personally reviewed following labs and imaging studies  CBC: Recent Labs  Lab 10/10/17 1134 10/11/17 0311 10/12/17 0256 10/13/17 0321 10/14/17 0330 10/15/17 0300  WBC 22.4* 18.2* 13.1* 8.5 7.9 10.3  NEUTROABS 20.6*  --  12.3* 8.0*  --   --   HGB 10.8* 10.0* 9.8* 10.4* 10.3* 10.5*  HCT 36.3 33.5* 32.3* 35.6* 35.5* 36.5  MCV 95.3 95.2 95.0 97.0 97.8  97.9  PLT 181 185 200 189 203 112   Basic Metabolic Panel: Recent Labs  Lab 10/11/17 1136 10/12/17 0256 10/13/17 0321 10/14/17 0330 10/15/17 0300  NA 145 147* 144 140 143  K 4.2 3.8 4.2 4.5 4.4  CL 99 99 98 96* 99  CO2 31 37* 35* 35* 35*  GLUCOSE 187* 212* 281* 347* 320*  BUN 45* 47* 46* 51* 59*  CREATININE 1.05* 1.11* 0.94 1.05* 1.39*  CALCIUM 8.8* 8.5* 8.7* 8.6* 8.8*  MG  --   --   --  2.5*  --   PHOS  --   --   --  3.1  --    GFR: Estimated Creatinine Clearance: 37.9 mL/min (A) (by C-G formula based on SCr of 1.39 mg/dL (H)).  Liver Function Tests: Recent Labs  Lab 10/10/17 1134 10/11/17 0311  AST 27 15  ALT 38 34  ALKPHOS 107 90  BILITOT 1.1 0.7  PROT 6.8 6.2*  ALBUMIN 3.1* 2.7*   No results for input(s): LIPASE, AMYLASE in the last 168 hours. No results for input(s): AMMONIA in the last 168 hours. Coagulation Profile: No results for input(s): INR, PROTIME in the last 168 hours. Cardiac Enzymes: Recent Labs  Lab 10/10/17 1134 10/10/17 1546 10/10/17 2225 10/11/17 0311  TROPONINI 0.10* 0.10* 0.10* 0.08*   BNP (last 3 results) No results for input(s): PROBNP in the last 8760 hours. HbA1C: No results for input(s): HGBA1C in the last 72 hours. CBG: Recent Labs  Lab 10/14/17 0739 10/14/17 1214 10/14/17 1614 10/14/17 2107 10/15/17 0749  GLUCAP 295* 337* 345* 191* 312*   Lipid Profile: No results for input(s): CHOL, HDL, LDLCALC, TRIG, CHOLHDL, LDLDIRECT in the last 72 hours. Thyroid Function Tests: No results for input(s): TSH, T4TOTAL, FREET4, T3FREE, THYROIDAB in the last 72 hours. Anemia Panel: No results for input(s): VITAMINB12, FOLATE, FERRITIN, TIBC, IRON, RETICCTPCT in the last 72 hours. Sepsis Labs: Recent Labs  Lab 10/10/17 1254 10/10/17 1546 10/11/17 0804 10/12/17 0256 10/13/17 0321  PROCALCITON  --  2.25 2.75 1.81 0.68  LATICACIDVEN 2.92*  --  1.2  --   --     Recent Results (from the past 240 hour(s))  Blood culture (routine x  2)     Status: None (Preliminary result)   Collection Time: 10/10/17 12:08 PM  Result Value Ref Range Status   Specimen Description   Final    BLOOD RIGHT ARM Performed at [facility] [address], [place], [place] [address]    Special Requests   Final    BOTTLES DRAWN AEROBIC AND ANAEROBIC Blood Culture adequate volume Performed at [facility] [address], [place], [place] [address]    Culture   Final    NO GROWTH 4 DAYS Performed at [facility], [place] [address] [place], [place] [address]    Report Status PENDING  Incomplete  Blood culture (routine x 2)     Status: Abnormal   Collection Time: 10/10/17 12:13 PM  Result Value Ref Range Status   Specimen Description   Final    BLOOD LEFT ARM Performed at [facility], [place] [address], [place], [place] [address]    Special Requests   Final    BOTTLES DRAWN AEROBIC AND ANAEROBIC Blood Culture adequate volume Performed at [facility] [address] [place], [place] [address]    Culture  Setup Time   Final    AEROBIC BOTTLE ONLY GRAM POSITIVE COCCI CRITICAL RESULT CALLED TO, READ BACK BY AND VERIFIED WITH: Colin Rhein [facility] 10/11/17 1583 JDW Performed at [facility], [address] [place], [place] [address]    Culture STREPTOCOCCUS PNEUMONIAE (A)  Final   Report Status 10/13/2017 FINAL  Final   Organism ID, Bacteria STREPTOCOCCUS PNEUMONIAE  Final      Susceptibility   Streptococcus pneumoniae - MIC*    ERYTHROMYCIN <=0.12 SENSITIVE Sensitive     LEVOFLOXACIN 0.5 SENSITIVE Sensitive     PENICILLIN (meningitis) <=0.06 SENSITIVE Sensitive     PENICILLIN (non-meningitis) <=0.06 SENSITIVE Sensitive     CEFTRIAXONE (non-meningitis) <=0.12 SENSITIVE Sensitive     CEFTRIAXONE (meningitis) <=0.12 SENSITIVE Sensitive     * STREPTOCOCCUS PNEUMONIAE  Blood Culture ID Panel (Reflexed)  Status: Abnormal   Collection Time: 10/10/17 12:13 PM  Result Value Ref Range  Status   Enterococcus species NOT DETECTED NOT DETECTED Final   Listeria monocytogenes NOT DETECTED NOT DETECTED Final   Staphylococcus species NOT DETECTED NOT DETECTED Final   Staphylococcus aureus NOT DETECTED NOT DETECTED Final   Streptococcus species DETECTED (A) NOT DETECTED Final    Comment: CRITICAL RESULT CALLED TO, READ BACK BY AND VERIFIED WITH: M BELL PHARMD 10/11/17 0211 JDW    Streptococcus agalactiae NOT DETECTED NOT DETECTED Final   Streptococcus pneumoniae DETECTED (A) NOT DETECTED Final    Comment: CRITICAL RESULT CALLED TO, READ BACK BY AND VERIFIED WITH: Jerilynn Mages BELL PHARMD 10/11/17 1735 JDW    Streptococcus pyogenes NOT DETECTED NOT DETECTED Final   Acinetobacter baumannii NOT DETECTED NOT DETECTED Final   Enterobacteriaceae species NOT DETECTED NOT DETECTED Final   Enterobacter cloacae complex NOT DETECTED NOT DETECTED Final   Escherichia coli NOT DETECTED NOT DETECTED Final   Klebsiella oxytoca NOT DETECTED NOT DETECTED Final   Klebsiella pneumoniae NOT DETECTED NOT DETECTED Final   Proteus species NOT DETECTED NOT DETECTED Final   Serratia marcescens NOT DETECTED NOT DETECTED Final   Haemophilus influenzae NOT DETECTED NOT DETECTED Final   Neisseria meningitidis NOT DETECTED NOT DETECTED Final   Pseudomonas aeruginosa NOT DETECTED NOT DETECTED Final   Candida albicans NOT DETECTED NOT DETECTED Final   Candida glabrata NOT DETECTED NOT DETECTED Final   Candida krusei NOT DETECTED NOT DETECTED Final   Candida parapsilosis NOT DETECTED NOT DETECTED Final   Candida tropicalis NOT DETECTED NOT DETECTED Final    Comment: Performed at [facility], [place] [address] [place], [place] [address]  MRSA PCR Screening     Status: Abnormal   Collection Time: 10/11/17  7:54 AM  Result Value Ref Range Status   MRSA by PCR POSITIVE (A) NEGATIVE Final    Comment:        The GeneXpert MRSA Assay (FDA approved for NASAL specimens only), is one component of a comprehensive MRSA  colonization surveillance program. It is not intended to diagnose MRSA infection nor to guide or monitor treatment for MRSA infections. RESULT CALLED TO, READ BACK BY AND VERIFIED WITH: DUONG,T. RN _0  ON 06.28.19 BY COHEN,K Performed at [facility], [place] [address] [place], [place] [address]   Culture, blood (Routine X 2) w Reflex to ID Panel     Status: None (Preliminary result)   Collection Time: 10/13/17  3:45 PM  Result Value Ref Range Status   Specimen Description   Final    BLOOD LEFT ARM Performed at [facility] [address], [place], [place] [address]    Special Requests   Final    BOTTLES DRAWN AEROBIC AND ANAEROBIC Blood Culture adequate volume Performed at [facility] [address], [place], [place] [address]    Culture   Final    NO GROWTH < 24 HOURS Performed at [facility] [address], [place], [place] [address]    Report Status PENDING  Incomplete  Culture, blood (Routine X 2) w Reflex to ID Panel     Status: None (Preliminary result)   Collection Time: 10/13/17  3:52 PM  Result Value Ref Range Status   Specimen Description   Final    BLOOD RIGHT HAND Performed at [facility] [address], [place], [place] [address]    Special Requests   Final  BOTTLES DRAWN AEROBIC ONLY Blood Culture adequate volume Performed at [facility] [address], [place], [place] [address]    Culture   Final    NO GROWTH < 24 HOURS Performed at [facility] [address], [place], [place] [address]    Report Status PENDING  Incomplete         Radiology Studies: No results found.      Scheduled Meds: . arformoterol  15 mcg Nebulization BID  . atorvastatin  40 mg Oral Daily  . budesonide (PULMICORT) nebulizer solution  0.5 mg Nebulization BID  . busPIRone  10 mg Oral TID  . chlorhexidine  15 mL Mouth Rinse BID  . Chlorhexidine  Gluconate Cloth  6 each Topical Q0600  . digoxin  0.125 mg Oral Daily  . diltiazem  180 mg Oral Daily  . enoxaparin (LOVENOX) injection  40 mg Subcutaneous Q24H  . famotidine  20 mg Oral Q12H  . furosemide  40 mg Oral Daily  . guaiFENesin  1,200 mg Oral BID  . haloperidol lactate  2.5 mg Intravenous Once  . insulin aspart  0-15 Units Subcutaneous TID WC  . insulin detemir  20 Units Subcutaneous BID  . ipratropium-albuterol  3 mL Nebulization TID  . magnesium oxide  400 mg Oral Q12H  . mouth rinse  15 mL Mouth Rinse q12n4p  . methylPREDNISolone (SOLU-MEDROL) injection  30 mg Intravenous Daily  . metoprolol succinate  50 mg Oral Daily  . mirtazapine  15 mg Oral QHS  . mupirocin ointment  1 application Nasal BID  . pantoprazole  40 mg Oral Q12H   Continuous Infusions: . cefTRIAXone (ROCEPHIN)  IV Stopped (10/14/17 1115)     LOS: 5 days    Time spent: 40 minutes    Irine Seal, MD [facility] Pager [phone]  If 7PM-7AM, please contact night-coverage [email or web address] Password [email or web address] 10/15/2017, 9:09 AM

## 2017-10-15 NOTE — Progress Notes (Signed)
PULMONARY / CRITICAL CARE MEDICINE   Name: Vanessa Alexander MRN: [id number] DOB: [birth year]    ADMISSION DATE:  10/10/2017 CONSULTATION DATE:  10/12/2017  REFERRING MD:  [place] Silos  CHIEF COMPLAINT:  Dyspnea  HISTORY OF PRESENT ILLNESS:   62 y/o female with severe COPD on 6L [place] who has experienced multiple hospitalizations for her lung problems was admitted on 6/27 with acute on chronic respiratory failure with hypoxemia due to strep pneumonia.    PAST MEDICAL HISTORY :  She  has a past medical history of CHF (congestive heart failure) ([place]), COPD (chronic obstructive pulmonary disease) ([place]), Cor pulmonale (chronic) ([place]), Diabetes mellitus without complication ([place]), Hypertension, Paroxysmal atrial fibrillation ([place]), and Pulmonary hypertension ([place]).   SUBJECTIVE: Afebrile / WBC 10.3.  I/O 1.5L+ for admit.  BiPAP overnight / 50% fiO2.  Transitioned off bipap this am. HFO2 15L  VITAL SIGNS: BP (!) 122/96   Pulse 89   Temp (!) 97.5 F (36.4 C) (Axillary)   Resp (!) 22   Ht _0  (1.499 m)   Wt 180 lb 12.4 oz (82 kg)   SpO2 93%   BMI 36.51 kg/m   HEMODYNAMICS:    VENTILATOR SETTINGS: FiO2 (%):  [50 %] 50 %  INTAKE / OUTPUT: I/O last 3 completed shifts: In: 460 [P.O.:360; IV Piggyback:100] Out: 500 [Urine:500]  PHYSICAL EXAMINATION: General: chronically ill appearing female in NAD sitting in chair HEENT: MM pink/moist, [place] O2  Neuro: AAOx4, speech clear, MAE  CV: s1s2 rrr, no m/r/g PULM: even/non-labored, lungs bilaterally with posterior basilar crackles  QB:HALP, non-tender, bsx4 active  Extremities: warm/dry, 1-2+ pitting edema  Skin: no rashes or lesions  LABS:  BMET Recent Labs  Lab 10/13/17 0321 10/14/17 0330 10/15/17 0300  NA 144 140 143  K 4.2 4.5 4.4  CL 98 96* 99  CO2 35* 35* 35*  BUN 46* 51* 59*  CREATININE 0.94 1.05* 1.39*  GLUCOSE 281* 347* 320*    Electrolytes Recent Labs  Lab 10/13/17 0321 10/14/17 0330 10/15/17 0300  CALCIUM  8.7* 8.6* 8.8*  MG  --  2.5*  --   PHOS  --  3.1  --     CBC Recent Labs  Lab 10/13/17 0321 10/14/17 0330 10/15/17 0300  WBC 8.5 7.9 10.3  HGB 10.4* 10.3* 10.5*  HCT 35.6* 35.5* 36.5  PLT 189 203 175    Coag's No results for input(s): APTT, INR in the last 168 hours.  Sepsis Markers Recent Labs  Lab 10/10/17 1254  10/11/17 0804 10/12/17 0256 10/13/17 0321  LATICACIDVEN 2.92*  --  1.2  --   --   PROCALCITON  --    < > 2.75 1.81 0.68   < > = values in this interval not displayed.    ABG Recent Labs  Lab 10/10/17 1142 10/10/17 1807  PHART 7.389 7.374  PCO2ART 56.9* 55.8*  PO2ART 68.3* 63.7*    Liver Enzymes Recent Labs  Lab 10/10/17 1134 10/11/17 0311  AST 27 15  ALT 38 34  ALKPHOS 107 90  BILITOT 1.1 0.7  ALBUMIN 3.1* 2.7*    Cardiac Enzymes Recent Labs  Lab 10/10/17 1546 10/10/17 2225 10/11/17 0311  TROPONINI 0.10* 0.10* 0.08*    Glucose Recent Labs  Lab 10/13/17 2125 10/14/17 0739 10/14/17 1214 10/14/17 1614 10/14/17 2107 10/15/17 0749  GLUCAP 255* 295* 337* 345* 191* 312*    Imaging No results found.   STUDIES:  06/2017 CT chest showed emphysema worse in upper lobes, cardiomegally, pulmonary vascular enlargement  09/2017 CXR images showing emphysema, cardiomegally, pulmonary vascular enlargement, bibasilar infiltrates  CULTURES: 6/27 blood > strep pneumo 6/30 blood > negative  ANTIBIOTICS: 6/27 vanc > x1 6/27 cefepime > x1 6/28 ceftriaxone >   SIGNIFICANT EVENTS:   LINES/TUBES:   DISCUSSION: 62 y/o female with advanced chronic respiratory failure with hypercarbia and hypoxemia due to emphysema and CHF admitted for another acute exacerbation of respiratory failure due to strep pneumo pneumonia.    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  Patient updated on plan of care.   Noe Gens, NP-C [facility] Pgr: [phone] or if no answer [phone] 10/15/2017, 9:23 AM

## 2017-10-15 NOTE — Progress Notes (Signed)
Chaplain consult due to referral by RN / Pt request.    Provided spiritual support at bedside.  Pt's faith figures prominently in coping with chronic illness.  She feels that she has not been as faithful as she would like and requested prayers at bedside.   Spoke with chaplain about exhaustion from prior weeks of hospitalization and nursing facility.   Requests follow up with chaplains for continued support.     [facility] Chaplain Jerene Pitch, MDiv, [facility]

## 2017-10-16 ENCOUNTER — Inpatient Hospital Stay (HOSPITAL_COMMUNITY): Payer: Medicare Other

## 2017-10-16 DIAGNOSIS — Z515 Encounter for palliative care: Secondary | ICD-10-CM

## 2017-10-16 DIAGNOSIS — R0600 Dyspnea, unspecified: Secondary | ICD-10-CM

## 2017-10-16 DIAGNOSIS — R0602 Shortness of breath: Secondary | ICD-10-CM

## 2017-10-16 DIAGNOSIS — Z7189 Other specified counseling: Secondary | ICD-10-CM

## 2017-10-16 DIAGNOSIS — J9601 Acute respiratory failure with hypoxia: Secondary | ICD-10-CM

## 2017-10-16 LAB — CBC
HEMATOCRIT: 36.5 % (ref 36.0–46.0)
Hemoglobin: 10.5 g/dL — ABNORMAL LOW (ref 12.0–15.0)
MCH: 28.2 pg (ref 26.0–34.0)
MCHC: 28.8 g/dL — AB (ref 30.0–36.0)
MCV: 98.1 fL (ref 78.0–100.0)
Platelets: 196 10*3/uL (ref 150–400)
RBC: 3.72 MIL/uL — ABNORMAL LOW (ref 3.87–5.11)
RDW: 16.2 % — ABNORMAL HIGH (ref 11.5–15.5)
WBC: 10 10*3/uL (ref 4.0–10.5)

## 2017-10-16 LAB — BASIC METABOLIC PANEL
Anion gap: 8 (ref 5–15)
BUN: 53 mg/dL — ABNORMAL HIGH (ref 8–23)
CALCIUM: 8.7 mg/dL — AB (ref 8.9–10.3)
CO2: 36 mmol/L — AB (ref 22–32)
CREATININE: 0.97 mg/dL (ref 0.44–1.00)
Chloride: 101 mmol/L (ref 98–111)
GFR calc non Af Amer: >60 mL/min (ref >60)
GLUCOSE: 358 mg/dL — AB (ref 70–99)
Potassium: 4.2 mmol/L (ref 3.5–5.1)
Sodium: 145 mmol/L (ref 135–145)

## 2017-10-16 LAB — GLUCOSE, CAPILLARY
GLUCOSE-CAPILLARY: 212 mg/dL — AB (ref 70–99)
Glucose-Capillary: 243 mg/dL — ABNORMAL HIGH (ref 70–99)
Glucose-Capillary: 272 mg/dL — ABNORMAL HIGH (ref 70–99)
Glucose-Capillary: 247 mg/dL — ABNORMAL HIGH (ref 70–99)

## 2017-10-16 MED ORDER — IPRATROPIUM-ALBUTEROL 0.5-2.5 (3) MG/3ML IN SOLN
3.0000 mL | RESPIRATORY_TRACT | Status: DC | PRN
  Administered 2017-10-21: 3 mL via RESPIRATORY_TRACT
  Filled 2017-10-16: qty 3

## 2017-10-16 MED ORDER — MIRTAZAPINE 15 MG PO TABS
30.0000 mg | ORAL_TABLET | Freq: Every day | ORAL | Status: DC
  Administered 2017-10-16 – 2017-10-21 (×5): 30 mg via ORAL
  Filled 2017-10-16 (×5): qty 2

## 2017-10-16 MED ORDER — IPRATROPIUM-ALBUTEROL 0.5-2.5 (3) MG/3ML IN SOLN: 3.0000 mL | Freq: Four times a day (QID) | RESPIRATORY_TRACT | Status: DC | PRN

## 2017-10-16 MED ORDER — MORPHINE SULFATE 10 MG/5ML PO SOLN
2.5000 mg | ORAL | Status: DC | PRN
  Administered 2017-10-17: 5 mg via ORAL
  Administered 2017-10-17 – 2017-10-20 (×7): 2.5 mg via ORAL
  Administered 2017-10-21 – 2017-10-22 (×6): 5 mg via ORAL
  Filled 2017-10-16 (×14): qty 5

## 2017-10-16 MED ORDER — FUROSEMIDE 10 MG/ML IJ SOLN: 40.0000 mg | Freq: Every day | INTRAMUSCULAR | Status: DC

## 2017-10-16 MED ORDER — FUROSEMIDE 10 MG/ML IJ SOLN
20.0000 mg | Freq: Two times a day (BID) | INTRAMUSCULAR | Status: DC
  Filled 2017-10-16: qty 2

## 2017-10-16 MED ORDER — CLONAZEPAM 0.5 MG PO TABS
0.2500 mg | ORAL_TABLET | Freq: Two times a day (BID) | ORAL | Status: DC | PRN
Start: 2017-10-16 — End: 2017-10-22
  Administered 2017-10-16 – 2017-10-21 (×6): 0.25 mg via ORAL
  Filled 2017-10-16 (×6): qty 1

## 2017-10-16 MED ORDER — FUROSEMIDE 10 MG/ML IJ SOLN
40.0000 mg | Freq: Every day | INTRAMUSCULAR | Status: DC
  Administered 2017-10-16: 40 mg via INTRAVENOUS
  Filled 2017-10-16: qty 4

## 2017-10-16 MED ORDER — INSULIN ASPART 100 UNIT/ML ~~LOC~~ SOLN
4.0000 [IU] | Freq: Three times a day (TID) | SUBCUTANEOUS | Status: DC
  Administered 2017-10-16 (×3): 4 [IU] via SUBCUTANEOUS

## 2017-10-16 NOTE — Progress Notes (Signed)
Patient requested bipap despite good oxygen saturation levels when RT was administering earlier breathing treatment. Rt placed on bipap. This nurse and other nurse have encouraged discontinued bipap use multiple times throughout last two hours wearing it due to consistent adequate oxygenation. Pt refuses to return to nonrebreather mask until it is time for her to eat. Will continue to educate and monitor.

## 2017-10-16 NOTE — Progress Notes (Signed)
PROGRESS NOTE    Vanessa Alexander  BEM:[id number] DOB: 1955-07-29 DOA: 10/10/2017 PCP: Townsend Roger, MD    Brief Narrative:  Vanessa Alexander is a 62 y.o. 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  Patient open BiPAP during my interview, however wake up and answer questions.  Patient was hospitalized 1 month ago at [facility] due to COPD exacerbation and pneumonia and was discharged to SNF for short-term rehab.  She just left the facility 1 day ago.  Patient reported that she was having difficulty breathing and worsening of her baseline shortness of breath for the past week or so and apparently this was not addressed.  Patient was unable to walk around her house with her baseline 6 L nasal cannula, developing cough and generalized fatigue.  Patient denies chest pain, palpitations and dizziness.  ED Course: EMS found patient with oxygen saturation in the low 80s, gave her steroids and placed on BiPAP.  Labs remarkable for WBC of 22, chest x-ray with increasing vascular congestion, and bibasilar opacities.  Elevated creatinine at 1.7 and mild elevated lactic acid at 2.9.  Patient was given IV fluids and antibiotics by EDP.      Assessment & Plan:   Principal Problem:   Pneumococcal bacteremia Active Problems:   Acute on chronic respiratory failure (HCC)   HCAP (healthcare-associated pneumonia)   Acute on chronic diastolic CHF (congestive heart failure) (HCC)   COPD (chronic obstructive pulmonary disease) (HCC)   HTN (hypertension)   AKI (acute kidney injury) (HCC)   DM (diabetes mellitus) (HCC)   Lactic acidosis   Elevated troponin   Hyperkalemia   AF (paroxysmal atrial fibrillation) (HCC)   SOB (shortness of breath)  #1 acute on chronic respiratory failure with hypoxia and hypercarbia Likely multifactorial  secondary to HCAP, acute COPD exacerbation( on 6 L nasal cannula baseline), acute on chronic diastolic CHF exacerbation.  Patient noted on admission to have lower extremity edema, with hypoxia and hypercarbia.  Chest x-ray which was done showed bibasilar infiltrates left greater than right.  Patient placed on the BiPAP and had been on the BiPAP overnight.  Patient with some clinical improvement.  Patient was transitioned to nonrebreather and feeling better than she did on 10/12/2017.  Patient states she is on BiPAP nightly.   Chest x-ray on admission was concerning for volume overload BNP was elevated at 726 with mildly elevated troponin.  Patient placed on IV Lasix, empiric IV vancomycin IV cefepime.  Blood cultures positive for strep pneumococcus.  Patient with a urine output not accurately recorded.  Currently on BiPAP and patient was on BiPAP intermittently the afternoon of 10/13/2017..  IV vancomycin and IV cefepime have been transitioned to IV Rocephin.  Continue IV Rocephin per ID recommendations.  Continue on high flow nasal cannula and keep sats 88 to 93%.  Repeat chest x-ray stable with left retrocardiac opacity and unchanged. Continue Brovana, Pulmicort, Mucinex, scheduled duo nebs, prednisone taper, PPI.  Continue to dose IV Lasix 20 mg BID.  PCCM following and I appreciate the input and recommendations..    2.  Gram-positive bacteremia secondary to Streptococcus pneumonia Preliminary blood cultures with gram-positive bacteremia with BCID showing Streptococcus pneumoniae.  Patient presented with acute on chronic respiratory failure with concerns for healthcare associated pneumonia.  Discontinued IV vancomycin and IV cefepime.  Continue IV Rocephin.  Blood  cultures repeated on 10/13/2017 pending.   Patient has been seen in consultation by ID who recommend continuing IV Rocephin and recommending a total of 10 days of antibiotic therapy at this time. Appreciate ID input and recommendations.    3.  Acute  on chronic diastolic heart failure Patient presented with worsening shortness of breath, chest x-ray worrisome for fluid overload, BNP elevated at 726 with mildly elevated troponin.  Patient also noted to have some lower extremity edema.  Patient with recent 2D echo done 07/25/2017 with normal systolic function, mild diastolic dysfunction, moderate LVH, D-shaped septum, severe dilated right atrium, increased right atrial pressure with a PA peak pressure of 58 mmHg.  Patient placed on IV Lasix with urine output not correctly recorded.  Current weight of 180 pounds from 180 pounds from 180 pounds from 172 pounds from 179 pounds on 10/11/2017 from 176 pounds on 10/10/2017.  IV Lasix was discontinued 10/12/2017.  Patient given Lasix 40 mg IV x1 on 10/13/2017.  Resumed home dose Lasix 40 mg daily today 10/14/2017.  Strict I's and O's.  Daily weights.   4. HCAP Patient noted to have recently been discharged from [facility] due to COPD and pneumonia was at a skilled nursing facility.  Patient presented with cough, leukocytosis.  Blood cultures positive for Streptococcus pneumonia.  Urine Legionella antigen positive.  Urine pneumococcus antigen negative.  Urine Legionella antigen pending.  MRSA PCR is positive.  Repeat procalcitonin level and lactic acid level which were elevated on admission are trending down..  Continue supportive treatment.  Discontinued IV vancomycin and IV cefepime and transitioned to IV Rocephin as blood cultures positive for Streptococcus pneumonia.  BiPAP QHS.  IV Rocephin to treat for total of 10 to 14-day course of antibiotic treatment.  Supportive care.   5.  Acute COPD exacerbation/chronic respiratory failure on 6 L nasal cannula home O2 No wheezing noted on examination.  Patient however with poor-fair air movement on 10/12/2017 with worsening shortness of breath.  Patient also being treated for pneumonia.  Patient placed on IV steroid taper.  Continue current nebulizer treatments, Pulmicort,  Brovana, duo nebs, Mucinex, empiric IV Rocephin.  Repeat chest x-ray with left retrocardiac opacity unchanged.  Keep sats between 88 to 93%.  He on intermittent BiPAP on for 2 hours off for 2 hours over the past 24 hours.  PCCM following.    6.  Acute kidney injury Likely cardiorenal.  Renal function was improving with diuresis.  Slight bump in creatinine. IV Lasix was held on 10/12/2017 secondary to hypernatremia.  Patient received a dose of IV Lasix on 10/13/2017.  Resumed home dose Lasix 40 mg daily.    7.  Hyperkalemia Resolved.  Status post Kayexalate.  Follow.   8.  Lactic acidosis Likely secondary to problem #1.  Continue current treatment as in problem #1.  Repeat lactic acid level with improvement.  Procalcitonin level trending down.    9.  Elevated troponin Likely secondary to demand ischemia from acute CHF exacerbation.  EKG with no ischemic changes noted.  Patient with recent 2D echo done and as such will not repeat.  Follow.  10.  Hypertension Continue current regimen of Cardizem, Lasix, Toprol-XL.   11.  Type 2 diabetes mellitus Patient on carb modified diet.  Hemoglobin A1c was 7.2 on 08/01/2017.  CBGs were elevated in the 300s yesterday.  Patient on IV steroids dose tapered down.  Levemir increased to 20 units twice daily.  Continue sliding scale insulin.    12.  SVT versus MAT/tachycardia vs hx of paroxysmal A. fib Likely secondary to acute pulmonary issues.  Continue home dose Cardizem and increased dose Toprol-XL 50 mg daily.  IV Lopressor as needed.Patient states had a history of recent bleed and as such anticoagulation was discontinued. Monitor.    DVT prophylaxis: Lovenox Code Status: Full Family Communication: Updated patient.  No family at bedside. Disposition Plan: Remain in stepdown unit.  To be determined.   Consultants:   ID: Dr. Megan Salon 10/11/2017  PCCM: Dr. Nelda Marseille 10/12/2017  Palliative care   Procedures:   Chest x-ray 10/10/2017, 10/11/2017,  10/12/2017    Antimicrobials:   IV vancomycin 10/10/2017>>>>> 10/11/2017  IV cefepime 10/10/2017>>>>> 10/11/2017  IV Rocephin 10/11/2017   Subjective: Patient on BiPAP in chair.  No acute complains, no nasuea or voimiting  Objective: Vitals:   10/16/17 1326 10/16/17 1600 10/16/17 1606 10/16/17 1715  BP:    127/83  Pulse: 86  91 74  Resp: (!) 32   20  Temp:  (!) 96.5 F (35.8 C)    TempSrc:  Axillary    SpO2: (!) 89%  96% (!) 88%  Weight:      Height:        Intake/Output Summary (Last 24 hours) at 10/16/2017 1849 Last data filed at 10/16/2017 1547 Gross per 24 hour  Intake 740 ml  Output 800 ml  Net -60 ml   Filed Weights   10/14/17 0500 10/15/17 0638 10/16/17 0500  Weight: 81.8 kg (180 lb 5.4 oz) 82 kg (180 lb 12.4 oz) 84.1 kg (185 lb 6.5 oz)    Examination:  General exam: On bipap in chair Respiratory system: Some bibasilar coarse breath sounds/crackles.  No wheezing noted poor to fair air movement.  No rhonchi. Cardiovascular system: Irregularly irregular.  No JVD.  No murmurs rubs or gallops.  1+ bilateral lower extremity edema.   Gastrointestinal system: Abdomen is nontender, nondistended, soft, positive bowel sounds.  No rebound.  No guarding.  Central nervous system: Alert and oriented. No focal neurological deficits. Extremities: Symmetric 5 x 5 power. Skin: No rashes, lesions or ulcers Psychiatry: Judgement and insight appear normal. Mood & affect appropriate.     Data Reviewed: I have personally reviewed following labs and imaging studies  CBC: Recent Labs  Lab 10/10/17 1134  10/12/17 0256 10/13/17 0321 10/14/17 0330 10/15/17 0300 10/16/17 0323  WBC 22.4*   < > 13.1* 8.5 7.9 10.3 10.0  NEUTROABS 20.6*  --  12.3* 8.0*  --   --   --   HGB 10.8*   < > 9.8* 10.4* 10.3* 10.5* 10.5*  HCT 36.3   < > 32.3* 35.6* 35.5* 36.5 36.5  MCV 95.3   < > 95.0 97.0 97.8 97.9 98.1  PLT 181   < > 200 189 203 175 196   < > = values in this interval not displayed.    Basic Metabolic Panel: Recent Labs  Lab 10/12/17 0256 10/13/17 0321 10/14/17 0330 10/15/17 0300 10/16/17 0323  NA 147* 144 140 143 145  K 3.8 4.2 4.5 4.4 4.2  CL 99 98 96* 99 101  CO2 37* 35* 35* 35* 36*  GLUCOSE 212* 281* 347* 320* 358*  BUN 47* 46* 51* 59* 53*  CREATININE 1.11* 0.94 1.05* 1.39* 0.97  CALCIUM 8.5* 8.7* 8.6* 8.8* 8.7*  MG  --   --  2.5*  --   --   PHOS  --   --  3.1  --   --  GFR: Estimated Creatinine Clearance: 55.1 mL/min (by C-G formula based on SCr of 0.97 mg/dL). Liver Function Tests: Recent Labs  Lab 10/10/17 1134 10/11/17 0311  AST 27 15  ALT 38 34  ALKPHOS 107 90  BILITOT 1.1 0.7  PROT 6.8 6.2*  ALBUMIN 3.1* 2.7*   No results for input(s): LIPASE, AMYLASE in the last 168 hours. No results for input(s): AMMONIA in the last 168 hours. Coagulation Profile: No results for input(s): INR, PROTIME in the last 168 hours. Cardiac Enzymes: Recent Labs  Lab 10/10/17 1134 10/10/17 1546 10/10/17 2225 10/11/17 0311  TROPONINI 0.10* 0.10* 0.10* 0.08*   BNP (last 3 results) No results for input(s): PROBNP in the last 8760 hours. HbA1C: No results for input(s): HGBA1C in the last 72 hours. CBG: Recent Labs  Lab 10/15/17 1644 10/15/17 2143 10/16/17 0740 10/16/17 1156 10/16/17 1628  GLUCAP 205* 433* 243* 272* 212*   Lipid Profile: No results for input(s): CHOL, HDL, LDLCALC, TRIG, CHOLHDL, LDLDIRECT in the last 72 hours. Thyroid Function Tests: No results for input(s): TSH, T4TOTAL, FREET4, T3FREE, THYROIDAB in the last 72 hours. Anemia Panel: No results for input(s): VITAMINB12, FOLATE, FERRITIN, TIBC, IRON, RETICCTPCT in the last 72 hours. Sepsis Labs: Recent Labs  Lab 10/10/17 1254 10/10/17 1546 10/11/17 0804 10/12/17 0256 10/13/17 0321  PROCALCITON  --  2.25 2.75 1.81 0.68  LATICACIDVEN 2.92*  --  1.2  --   --     Recent Results (from the past 240 hour(s))  Blood culture (routine x 2)     Status: None   Collection Time:  10/10/17 12:08 PM  Result Value Ref Range Status   Specimen Description   Final    BLOOD RIGHT ARM Performed at [facility] [address], [place], [place] [address]    Special Requests   Final    BOTTLES DRAWN AEROBIC AND ANAEROBIC Blood Culture adequate volume Performed at [facility] [address], [place], [place] [address]    Culture   Final    NO GROWTH 5 DAYS Performed at [facility], [place] [address], [place], [place] [address]    Report Status 10/15/2017 FINAL  Final  Blood culture (routine x 2)     Status: Abnormal   Collection Time: 10/10/17 12:13 PM  Result Value Ref Range Status   Specimen Description   Final    BLOOD LEFT ARM Performed at [facility], [place] [address], [place], [place] [address]    Special Requests   Final    BOTTLES DRAWN AEROBIC AND ANAEROBIC Blood Culture adequate volume Performed at [facility] [address], [place], [place] [address]    Culture  Setup Time   Final    AEROBIC BOTTLE ONLY GRAM POSITIVE COCCI CRITICAL RESULT CALLED TO, READ BACK BY AND VERIFIED WITH: Colin Rhein [facility] 10/11/17 0175 JDW Performed at [facility], [address] [place],  [address]    Culture STREPTOCOCCUS PNEUMONIAE (A)  Final   Report Status 10/13/2017 FINAL  Final   Organism ID, Bacteria STREPTOCOCCUS PNEUMONIAE  Final      Susceptibility   Streptococcus pneumoniae - MIC*    ERYTHROMYCIN <=0.12 SENSITIVE Sensitive     LEVOFLOXACIN 0.5 SENSITIVE Sensitive     PENICILLIN (meningitis) <=0.06 SENSITIVE Sensitive     PENICILLIN (non-meningitis) <=0.06 SENSITIVE Sensitive     CEFTRIAXONE (non-meningitis) <=0.12 SENSITIVE Sensitive     CEFTRIAXONE (meningitis) <=0.12 SENSITIVE Sensitive     *  STREPTOCOCCUS PNEUMONIAE  Blood Culture ID Panel (Reflexed)     Status: Abnormal   Collection Time: 10/10/17 12:13 PM  Result Value Ref Range Status   Enterococcus species NOT DETECTED NOT DETECTED  Final   Listeria monocytogenes NOT DETECTED NOT DETECTED Final   Staphylococcus species NOT DETECTED NOT DETECTED Final   Staphylococcus aureus NOT DETECTED NOT DETECTED Final   Streptococcus species DETECTED (A) NOT DETECTED Final    Comment: CRITICAL RESULT CALLED TO, READ BACK BY AND VERIFIED WITH: M BELL PHARMD 10/11/17 1610 JDW    Streptococcus agalactiae NOT DETECTED NOT DETECTED Final   Streptococcus pneumoniae DETECTED (A) NOT DETECTED Final    Comment: CRITICAL RESULT CALLED TO, READ BACK BY AND VERIFIED WITH: Jerilynn Mages BELL PHARMD 10/11/17 9604 JDW    Streptococcus pyogenes NOT DETECTED NOT DETECTED Final   Acinetobacter baumannii NOT DETECTED NOT DETECTED Final   Enterobacteriaceae species NOT DETECTED NOT DETECTED Final   Enterobacter cloacae complex NOT DETECTED NOT DETECTED Final   Escherichia coli NOT DETECTED NOT DETECTED Final   Klebsiella oxytoca NOT DETECTED NOT DETECTED Final   Klebsiella pneumoniae NOT DETECTED NOT DETECTED Final   Proteus species NOT DETECTED NOT DETECTED Final   Serratia marcescens NOT DETECTED NOT DETECTED Final   Haemophilus influenzae NOT DETECTED NOT DETECTED Final   Neisseria meningitidis NOT DETECTED NOT DETECTED Final   Pseudomonas aeruginosa NOT DETECTED NOT DETECTED Final   Candida albicans NOT DETECTED NOT DETECTED Final   Candida glabrata NOT DETECTED NOT DETECTED Final   Candida krusei NOT DETECTED NOT DETECTED Final   Candida parapsilosis NOT DETECTED NOT DETECTED Final   Candida tropicalis NOT DETECTED NOT DETECTED Final    Comment: Performed at [facility], [place] [address], [place], [place] [address]  MRSA PCR Screening     Status: Abnormal   Collection Time: 10/11/17  7:54 AM  Result Value Ref Range Status   MRSA by PCR POSITIVE (A) NEGATIVE Final    Comment:        The GeneXpert MRSA Assay (FDA approved for NASAL specimens only), is one component of a comprehensive MRSA colonization surveillance program. It is not intended  to diagnose MRSA infection nor to guide or monitor treatment for MRSA infections. RESULT CALLED TO, READ BACK BY AND VERIFIED WITH: DUONG,T. RN _0  ON 06.28.19 BY COHEN,K Performed at [facility], [place] [address], [place], [place] [address]   Culture, blood (Routine X 2) w Reflex to ID Panel     Status: None (Preliminary result)   Collection Time: 10/13/17  3:45 PM  Result Value Ref Range Status   Specimen Description   Final    BLOOD LEFT ARM Performed at [facility] [address], [place], [place] [address]    Special Requests   Final    BOTTLES DRAWN AEROBIC AND ANAEROBIC Blood Culture adequate volume Performed at [facility] [address] [place], [place] [address]    Culture   Final    NO GROWTH 3 DAYS Performed at [facility], [place] [address], [place], [place] [address]    Report Status PENDING  Incomplete  Culture, blood (Routine X 2) w Reflex to ID Panel     Status: None (Preliminary result)   Collection Time: 10/13/17  3:52 PM  Result Value Ref Range Status   Specimen Description   Final    BLOOD RIGHT HAND Performed at [facility] [address] [place],  [place] [address]    Special Requests   Final    BOTTLES DRAWN AEROBIC ONLY Blood Culture adequate volume Performed at [facility] [address], [place], [place] [address]    Culture   Final    NO GROWTH 3 DAYS Performed at [facility], [place] [address] [place], [place] [address]    Report Status PENDING  Incomplete         Radiology Studies: Dg Chest Port 1 View  Result Date: 10/16/2017 CLINICAL DATA:  Acute respiratory failure with hypoxia. EXAM: PORTABLE CHEST 1 VIEW COMPARISON:  Radiograph of October 12, 2017. FINDINGS: Stable cardiomegaly. Stable left basilar opacity is noted concerning for atelectasis or infiltrate with possible pleural effusion. Mild right basilar atelectasis or  infiltrate is noted. No pneumothorax is noted. Bony thorax is unremarkable. IMPRESSION: Stable left basilar opacity consistent with atelectasis or infiltrate with possible pleural effusion. Mildly increased right basilar atelectasis or infiltrate is noted. Electronically Signed   By: Marijo Conception, M.D.   On: 10/16/2017 07:14        Scheduled Meds: . arformoterol  15 mcg Nebulization BID  . atorvastatin  40 mg Oral Daily  . budesonide (PULMICORT) nebulizer solution  0.5 mg Nebulization BID  . busPIRone  10 mg Oral TID  . chlorhexidine  15 mL Mouth Rinse BID  . Chlorhexidine Gluconate Cloth  6 each Topical Q0600  . digoxin  0.125 mg Oral Daily  . diltiazem  180 mg Oral Daily  . enoxaparin (LOVENOX) injection  40 mg Subcutaneous Q24H  . famotidine  20 mg Oral Q12H  . furosemide  40 mg Intravenous Daily  . guaiFENesin  1,200 mg Oral BID  . insulin aspart  0-15 Units Subcutaneous TID WC  . insulin aspart  4 Units Subcutaneous TID WC  . insulin detemir  20 Units Subcutaneous BID  . ipratropium-albuterol  3 mL Nebulization TID  . magnesium oxide  400 mg Oral Q12H  . mouth rinse  15 mL Mouth Rinse q12n4p  . metoprolol succinate  50 mg Oral Daily  . mirtazapine  30 mg Oral QHS  . pantoprazole  40 mg Oral Q12H  . predniSONE  20 mg Oral Q breakfast   Continuous Infusions: . cefTRIAXone (ROCEPHIN)  IV Stopped (10/16/17 1100)     LOS: 6 days    Time spent: 40 minutes    Berle Mull, MD [facility]  If 7PM-7AM, please contact night-coverage [email or web address] Password [facility] 10/16/2017, 6:49 PM

## 2017-10-16 NOTE — Progress Notes (Addendum)
PULMONARY / CRITICAL CARE MEDICINE   Name: Vanessa Alexander MRN: [id number] DOB: [birth year]    ADMISSION DATE:  10/10/2017 CONSULTATION DATE:  10/12/2017  REFERRING MD:  [place] Silos  CHIEF COMPLAINT:  Dyspnea  HISTORY OF PRESENT ILLNESS:   62 y/o female with severe COPD on 6L [place] who has experienced multiple hospitalizations for her lung problems was admitted on 6/27 with acute on chronic respiratory failure with hypoxemia due to strep pneumonia.    PAST MEDICAL HISTORY :  She  has a past medical history of CHF (congestive heart failure) ([place]), COPD (chronic obstructive pulmonary disease) ([place]), Cor pulmonale (chronic) ([place]), Diabetes mellitus without complication ([place]), Hypertension, Paroxysmal atrial fibrillation ([place]), and Pulmonary hypertension ([place]).   SUBJECTIVE:  RN reports pt remains on BiPAP > feels she uses it for anxiety.  Pt on/off BiPAP overnight.   VITAL SIGNS: BP 129/84   Pulse 70   Temp (!) 96.5 F (35.8 C) (Axillary)   Resp 13   Ht _0  (1.499 m)   Wt 185 lb 6.5 oz (84.1 kg)   SpO2 95%   BMI 37.45 kg/m   HEMODYNAMICS:    VENTILATOR SETTINGS: FiO2 (%):  [50 %] 50 %  INTAKE / OUTPUT: I/O last 3 completed shifts: In: 36 [P.O.:360; IV Piggyback:100] Out: 500 [Urine:500]  PHYSICAL EXAMINATION: General: adult female in NAD, lying in bed with BiPAP mask in place HEENT: MM pink/dry, full face mask Neuro: Awake, alert, appropriate, MAE  CV: s1s2 rrr, no m/r/g PULM: even/non-labored, lungs bilaterally diminished  IT:GPQD, non-tender, bsx4 active  Extremities: warm/dry, 1-2+ BLE pitting edema  Skin: no rashes or lesions  LABS:  BMET Recent Labs  Lab 10/14/17 0330 10/15/17 0300 10/16/17 0323  NA 140 143 145  K 4.5 4.4 4.2  CL 96* 99 101  CO2 35* 35* 36*  BUN 51* 59* 53*  CREATININE 1.05* 1.39* 0.97  GLUCOSE 347* 320* 358*    Electrolytes Recent Labs  Lab 10/14/17 0330 10/15/17 0300 10/16/17 0323  CALCIUM 8.6* 8.8* 8.7*  MG 2.5*  --   --    PHOS 3.1  --   --     CBC Recent Labs  Lab 10/14/17 0330 10/15/17 0300 10/16/17 0323  WBC 7.9 10.3 10.0  HGB 10.3* 10.5* 10.5*  HCT 35.5* 36.5 36.5  PLT 203 175 196    Coag's No results for input(s): APTT, INR in the last 168 hours.  Sepsis Markers Recent Labs  Lab 10/10/17 1254  10/11/17 0804 10/12/17 0256 10/13/17 0321  LATICACIDVEN 2.92*  --  1.2  --   --   PROCALCITON  --    < > 2.75 1.81 0.68   < > = values in this interval not displayed.    ABG Recent Labs  Lab 10/10/17 1142 10/10/17 1807  PHART 7.389 7.374  PCO2ART 56.9* 55.8*  PO2ART 68.3* 63.7*    Liver Enzymes Recent Labs  Lab 10/10/17 1134 10/11/17 0311  AST 27 15  ALT 38 34  ALKPHOS 107 90  BILITOT 1.1 0.7  ALBUMIN 3.1* 2.7*    Cardiac Enzymes Recent Labs  Lab 10/10/17 1546 10/10/17 2225 10/11/17 0311  TROPONINI 0.10* 0.10* 0.08*    Glucose Recent Labs  Lab 10/14/17 2107 10/15/17 0749 10/15/17 1200 10/15/17 1644 10/15/17 2143 10/16/17 0740  GLUCAP 191* 312* 288* 205* 433* 243*    Imaging Dg Chest Port 1 View  Result Date: 10/16/2017 CLINICAL DATA:  Acute respiratory failure with hypoxia. EXAM: PORTABLE CHEST 1 VIEW COMPARISON:  Radiograph of October 12, 2017. FINDINGS: Stable cardiomegaly. Stable left basilar opacity is noted concerning for atelectasis or infiltrate with possible pleural effusion. Mild right basilar atelectasis or infiltrate is noted. No pneumothorax is noted. Bony thorax is unremarkable. IMPRESSION: Stable left basilar opacity consistent with atelectasis or infiltrate with possible pleural effusion. Mildly increased right basilar atelectasis or infiltrate is noted. Electronically Signed   By: Marijo Conception, M.D.   On: 10/16/2017 07:14     STUDIES:  06/2017 CT chest showed emphysema worse in upper lobes, cardiomegally, pulmonary vascular enlargement 09/2017 CXR images showing emphysema, cardiomegally, pulmonary vascular enlargement, bibasilar  infiltrates  CULTURES: 6/27 blood > strep pneumo 6/30 blood > negative  ANTIBIOTICS: 6/27 vanc > x1 6/27 cefepime > x1 6/28 ceftriaxone >   SIGNIFICANT EVENTS: 6/27  Admit  7/03  Remains on intermittent BiPAP / NRB   LINES/TUBES:   DISCUSSION: 62 y/o female with advanced chronic respiratory failure with hypercarbia and hypoxemia due to emphysema and CHF admitted for another acute exacerbation of respiratory failure due to strep pneumo pneumonia.    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  Patient updated on plan of care.  Given her baseline pulmonary status, overall symptom burden / 6L baseline and slow progress will consult palliative care for goals of care.    Noe Gens, NP-C [facility] Pgr: [phone] or if no answer [phone] 10/16/2017, 8:29 AM

## 2017-10-16 NOTE — Progress Notes (Signed)
Inpatient Diabetes Program Recommendations  AACE/ADA: New Consensus Statement on Inpatient Glycemic Control (2015)  Target Ranges:  Prepandial:   less than 140 mg/dL      Peak postprandial:   less than 180 mg/dL (1-2 hours)      Critically ill patients:  140 - 180 mg/dL   Results for Vanessa Alexander, Vanessa Alexander (MRN [id number]) as of 10/16/2017 14:38  Ref. Range 10/15/2017 07:49 10/15/2017 12:00 10/15/2017 16:44 10/15/2017 21:43  Glucose-Capillary Latest Ref Range: 70 - 99 mg/dL 312 (H) 288 (H) 205 (H) 433 (H)   Results for Vanessa Alexander, Vanessa Alexander (MRN [id number]) as of 10/16/2017 14:38  Ref. Range 10/16/2017 07:40 10/16/2017 11:56  Glucose-Capillary Latest Ref Range: 70 - 99 mg/dL 243 (H) 272 (H)    Home DM Meds: Levemir 12 units BID                             Humalog 2-10 units TID                             Metformin 500 mg BID  Current Insulin Orders: Levemir 20 units BID                                       Novolog Moderate Correction Scale/ SSI (0-15 units) TID AC      Novolog 4 units TID with meals       Note Solumedrol stopped--Last dose given yesterday at 9:30am.  Now getting Prednisone 20 mg daily.  Note that Novolog 4 units TID with meals (meal coverage) started this AM.    MD- If patient continues to have issues with elevated post-meal CBGs, please consider increasing Novolog Meal Coverage to:  Novolog 6 units TID with meals   (Please add the following Hold Parameters: Hold if pt eats <50% of meal, Hold if pt NPO)     --Will follow patient during hospitalization--  Wyn Quaker RN, MSN, CDE Diabetes Coordinator Inpatient Glycemic Control Team Team Pager: [phone] (8a-5p)

## 2017-10-16 NOTE — Progress Notes (Signed)
   10/16/17 1000  Clinical Encounter Type  Visited With Patient  Visit Type Initial;Psychological support;Spiritual support;Critical Care  Referral From Nurse  Consult/Referral To Chaplain  Spiritual Encounters  Spiritual Needs Emotional;Other (Comment) (Spiritual Care Conversation/Support)  Stress Factors  Patient Stress Factors Health changes;Major life changes   I visited with the patient per referral from the nurse. The patient was receptive to support and stated that she feels "anxious and jittery." The patient stated that she needs "rest" and feels tired.   Please, contact Spiritual Care for further assistance.   Chaplain Shanon Ace M.Div., [facility]

## 2017-10-16 NOTE — Progress Notes (Signed)
Patient currently off BiPAP.

## 2017-10-16 NOTE — Consult Note (Signed)
Consultation Note Date: 10/16/2017   Patient Name: Vanessa Alexander  DOB: 02-Jan-1956  MRN: [id number]  Age / Sex: 63 y.o., female  PCP: Vanessa Roger, MD Referring Physician: Lavina Hamman, MD  Reason for Consultation: Establishing goals of care and Non pain symptom management  HPI/Patient Profile: 62 y.o. female  with past medical history of COPD oxygen been on 6 L, hypertension, CHF, diabetes type 2, SVT with MAT admitted on 10/10/2017 with shortness of breath and COPD exacerbation.  Palliative consulted for goals of care.  Clinical Assessment and Goals of Care: I met today with Vanessa Alexander.  Granddaughter was also present in the room.  We discussed clinical course as well as wishes moving forward in regard to advanced directives.  Concepts specific to code status and care this hospitalization discussed.    We spent a great deal the time discussing her significant symptom burden in the emotional toll this is played on her.  We also talked about prior hospitalization including prior hospitalizations in [facility] system where she had met with palliative and per my review of care everywhere, had agreed at one point to home hospice.  Today, she reports that she is feeling too poorly to even engage in further goals conversation.  We therefore decided to focus on symptom management today including her significant shortness of breath and anxiety.  We talked about having these to enter planning to other and she is in agreement that once we work on a little bit more symptom management will need to continue to discuss long-term goals of care.  She reports that she feels much better whenever she wears the BiPAP and often requests to wear throughout the day and becomes frustrated whenever she is encouraged not to utilize it.  We therefore discussed strategies to help work on symptom of dyspnea with medications in  order to see if we can eliminate daytime BiPAP use as her saturations have been good and often the sensation of dyspnea is not directly correlated to oxygenation.  Concept of Hospice and Palliative Care were discussed  Questions and concerns addressed.   PMT will continue to support holistically.   SUMMARY OF RECOMMENDATIONS   - For anxiety: Increase Remeron to 61m QHS.  D/c xanax and ativan in favor of klonopin, which is longer acting.  Start with 0.222mQ12hours prn, but may need to consider increasing.  Her anxiety is also driven largely by her dyspnea.  See next. - For dyspnea: Patient currently has MSIR 7.5 ordered for pain, but has not used any doses.  Plan to decrease dose to 2.56m40m 56mg40mery4 hours prn to see if this will help with dyspnea.  She has been maintaining sats without Bipap, but continually demands to wear it.  We discussed plan to trial low dose opioid to see if this provides enough symptom relief of dyspnea to avoid Bipap throughout the day.   - Began discussion regarding GOC.  Patient endorses being DNR/DNI in the past, but wants to maintain full code/full scope  treatment at this time. Continue conversation regarding expectations and goals.    Code Status/Advance Care Planning:  Full code   Symptom Management:   As above  Palliative Prophylaxis:   Frequent Pain Assessment  Additional Recommendations (Limitations, Scope, Preferences):  Full Scope Treatment  Psycho-social/Spiritual:   Desire for further Chaplaincy support:Already involved  Additional Recommendations: Education on Hospice  Prognosis:   Guarded  Discharge Planning: To Be Determined      Primary Diagnoses: Present on Admission: . Acute on chronic respiratory failure ([place]) . Pneumococcal bacteremia . HCAP (healthcare-associated pneumonia) . Acute on chronic diastolic CHF (congestive heart failure) ([place]) . COPD (chronic obstructive pulmonary disease) ([place]) . HTN (hypertension) . AKI  (acute kidney injury) ([place]) . Lactic acidosis . Elevated troponin . AF (paroxysmal atrial fibrillation) ([place])   I have reviewed the medical record, interviewed the patient and family, and examined the patient. The following aspects are pertinent.  Past Medical History:  Diagnosis Date  . CHF (congestive heart failure) ([place])   . COPD (chronic obstructive pulmonary disease) ([place])   . Cor pulmonale (chronic) ([place])   . Diabetes mellitus without complication ([place])   . Hypertension   . Paroxysmal atrial fibrillation (H[facility])   . Pulmonary hypertension ([place])    Social History   Socioeconomic History  . Marital status: Unknown    Spouse name: Not on file  . Number of children: Not on file  . Years of education: Not on file  . Highest education level: Not on file  Occupational History  . Not on file  Social Needs  . Financial resource strain: Not on file  . Food insecurity:    Worry: Not on file    Inability: Not on file  . Transportation needs:    Medical: Not on file    Non-medical: Not on file  Tobacco Use  . Smoking status: Former Smoker    Types: Cigarettes    Last attempt to quit: 06/08/2013    Years since quitting: 4.3  . Smokeless tobacco: Never Used  Substance and Sexual Activity  . Alcohol use: Never    Frequency: Never  . Drug use: Never  . Sexual activity: Not Currently  Lifestyle  . Physical activity:    Days per week: Not on file    Minutes per session: Not on file  . Stress: Not on file  Relationships  . Social connections:    Talks on phone: Not on file    Gets together: Not on file    Attends religious service: Not on file    Active member of club or organization: Not on file    Attends meetings of clubs or organizations: Not on file    Relationship status: Not on file  Other Topics Concern  . Not on file  Social History Narrative  . Not on file   History reviewed. No pertinent family history. Scheduled Meds: . arformoterol  15 mcg Nebulization  BID  . atorvastatin  40 mg Oral Daily  . budesonide (PULMICORT) nebulizer solution  0.5 mg Nebulization BID  . busPIRone  10 mg Oral TID  . chlorhexidine  15 mL Mouth Rinse BID  . Chlorhexidine Gluconate Cloth  6 each Topical Q0600  . digoxin  0.125 mg Oral Daily  . diltiazem  180 mg Oral Daily  . enoxaparin (LOVENOX) injection  40 mg Subcutaneous Q24H  . famotidine  20 mg Oral Q12H  . furosemide  40 mg Intravenous Daily  . guaiFENesin  1,200 mg Oral  BID  . insulin aspart  0-15 Units Subcutaneous TID WC  . insulin aspart  4 Units Subcutaneous TID WC  . insulin detemir  20 Units Subcutaneous BID  . ipratropium-albuterol  3 mL Nebulization TID  . magnesium oxide  400 mg Oral Q12H  . mouth rinse  15 mL Mouth Rinse q12n4p  . metoprolol succinate  50 mg Oral Daily  . mirtazapine  30 mg Oral QHS  . pantoprazole  40 mg Oral Q12H  . predniSONE  20 mg Oral Q breakfast   Continuous Infusions: . cefTRIAXone (ROCEPHIN)  IV Stopped (10/16/17 1100)   PRN Meds:.acetaminophen **OR** acetaminophen, clonazePAM, guaiFENesin-dextromethorphan, ipratropium-albuterol, metoprolol tartrate, morphine, ondansetron **OR** ondansetron (ZOFRAN) IV, senna-docusate, sodium chloride, witch hazel-glycerin Medications Prior to Admission:  Prior to Admission medications   Medication Sig Start Date End Date Taking? Authorizing Provider  acetaminophen (TYLENOL) 650 MG CR tablet Take 650 mg by mouth every 8 (eight) hours as needed for pain.   Yes [provider]  ALPRAZolam (XANAX) 0.25 MG tablet Take 0.25 mg by mouth every 12 (twelve) hours as needed for anxiety.   Yes [provider]  atorvastatin (LIPITOR) 40 MG tablet Take 40 mg by mouth daily.   Yes [provider]  budesonide (PULMICORT) 0.5 MG/2ML nebulizer solution Take 0.5 mg by nebulization every 12 (twelve) hours.   Yes [provider]  busPIRone (BUSPAR) 10 MG tablet Take 10 mg by mouth 3 (three) times daily.   Yes  [provider]  digoxin (LANOXIN) 0.125 MG tablet Take 0.125 mg by mouth daily.   Yes [provider]  diltiazem (DILACOR XR) 180 MG 24 hr capsule Take 180 mg by mouth daily.   Yes [provider]  famotidine (PEPCID) 20 MG tablet Take 20 mg by mouth every 12 (twelve) hours.   Yes [provider]  furosemide (LASIX) 40 MG tablet Take 40 mg by mouth daily.   Yes [provider]  guaiFENesin (MUCINEX) 600 MG 12 hr tablet Take 600 mg by mouth 2 (two) times daily.   Yes [provider]  insulin detemir (LEVEMIR) 100 UNIT/ML injection Inject 12 Units into the skin every 12 (twelve) hours.   Yes [provider]  ipratropium-albuterol (DUONEB) 0.5-2.5 (3) MG/3ML SOLN Take 2.5 mLs by nebulization every 4 (four) hours while awake.   Yes [provider]  levalbuterol (XOPENEX) 0.63 MG/3ML nebulizer solution Take 0.63 mg by nebulization every 4 (four) hours while awake.   Yes [provider]  magnesium oxide (MAG-OX) 400 MG tablet Take 400 mg by mouth every 12 (twelve) hours.   Yes [provider]  metFORMIN (GLUCOPHAGE) 500 MG tablet Take 500 mg by mouth 2 (two) times daily with a meal.   Yes [provider]  metoprolol succinate (TOPROL-XL) 25 MG 24 hr tablet Take 25 mg by mouth daily.   Yes [provider]  mirtazapine (REMERON) 15 MG tablet Take 15 mg by mouth at bedtime.   Yes [provider]  morphine (MSIR) 15 MG tablet Take 7.5 mg by mouth every 12 (twelve) hours as needed for severe pain.   Yes [provider]  pantoprazole (PROTONIX) 40 MG tablet Take 40 mg by mouth every 12 (twelve) hours.   Yes [provider]  potassium chloride SA (K-DUR,KLOR-CON) 20 MEQ tablet Take 40 mEq by mouth daily.   Yes [provider]  predniSONE (DELTASONE) 10 MG tablet Take 10-40 mg by mouth See admin instructions. Take 40 mg  by mouth daily for 4 days then 30 mg by mouth daily  for 4 days then 20 mg by mouth daily for 4 days and then take 10 mg by mouth daily   Yes [provider]  sennosides-docusate sodium (SENOKOT-S) 8.6-50 MG tablet Take 2 tablets by mouth daily as needed for constipation.   Yes [provider]  insulin lispro (HUMALOG) 100 UNIT/ML injection Inject 2-10 Units into the skin 3 (three) times daily before meals.    [provider]  morphine (MSIR) 15 MG tablet Take 7.5 mg by mouth every 6 (six) hours as needed (dyspnea).     [provider]  oxyCODONE-acetaminophen (PERCOCET/ROXICET) 5-325 MG tablet Take 1 tablet by mouth every 6 (six) hours as needed for severe pain.    [provider]  rivaroxaban (XARELTO) 20 MG TABS tablet Take 20 mg by mouth daily.    [provider]   Allergies  Allergen Reactions  . Lisinopril Swelling  . Codeine Rash   Review of Systems  Respiratory: Positive for shortness of breath.   Psychiatric/Behavioral: Positive for sleep disturbance.   Physical Exam General: Alert, awake, in bedside chair with NRB in place.  HEENT: No bruits, no goiter, no JVD Heart: Irregular rhythm. No murmur appreciated. Lungs: Coarse in bases, diminished air movement Abdomen: Soft, nontender, nondistended, positive bowel sounds.  Ext: 1+ edema Skin: Warm and dry Neuro: Grossly intact, nonfocal.   Vital Signs: BP (!) 107/54 (BP Location: Right Arm)   Pulse 77   Temp 98.1 F (36.7 C) (Oral)   Resp 14   Ht _0  (1.499 m)   Wt 84.1 kg (185 lb 6.5 oz)   SpO2 (!) 86%   BMI 37.45 kg/m  Pain Scale: 0-10 POSS *See Group Information*: 1-Acceptable,Awake and alert Pain Score: 0-No pain   SpO2: SpO2: (!) 86 % O2 Device:SpO2: (!) 86 % O2 Flow Rate: .O2 Flow Rate (L/min): 15 L/min  IO: Intake/output summary:   Intake/Output Summary (Last 24 hours) at 10/16/2017 2131 Last data filed at 10/16/2017 1547 Gross per 24 hour  Intake 740 ml  Output 800 ml  Net -60 ml    LBM: Last BM  Date: 10/16/17 Baseline Weight: Weight: 77.1 kg (170 lb) Most recent weight: Weight: 84.1 kg (185 lb 6.5 oz)     Palliative Assessment/Data:     Time In: 1720 Time Out: 1840 Time Total: 80 Greater than 50%  of this time was spent counseling and coordinating care related to the above assessment and plan.  Signed by: Micheline Rough, MD   Please contact Palliative Medicine Team phone at [phone] for questions and concerns.  For individual provider: See Shea Evans

## 2017-10-17 ENCOUNTER — Inpatient Hospital Stay (HOSPITAL_COMMUNITY): Payer: Medicare Other

## 2017-10-17 LAB — MAGNESIUM
Magnesium: 2.3 mg/dL (ref 1.7–2.4)
Magnesium: 2 mg/dL (ref 1.7–2.4)

## 2017-10-17 LAB — CBC
HCT: 36.9 % (ref 36.0–46.0)
Hemoglobin: 10.7 g/dL — ABNORMAL LOW (ref 12.0–15.0)
MCH: 28.2 pg (ref 26.0–34.0)
MCHC: 29 g/dL — ABNORMAL LOW (ref 30.0–36.0)
MCV: 97.4 fL (ref 78.0–100.0)
PLATELETS: 229 10*3/uL (ref 150–400)
RBC: 3.79 MIL/uL — ABNORMAL LOW (ref 3.87–5.11)
RDW: 16.6 % — AB (ref 11.5–15.5)
WBC: 10.3 10*3/uL (ref 4.0–10.5)

## 2017-10-17 LAB — BASIC METABOLIC PANEL
ANION GAP: 13 (ref 5–15)
Anion gap: 9 (ref 5–15)
BUN: 42 mg/dL — ABNORMAL HIGH (ref 8–23)
BUN: 36 mg/dL — ABNORMAL HIGH (ref 8–23)
CALCIUM: 8.7 mg/dL — AB (ref 8.9–10.3)
CALCIUM: 8.5 mg/dL — AB (ref 8.9–10.3)
CHLORIDE: 97 mmol/L — AB (ref 98–111)
CO2: 36 mmol/L — ABNORMAL HIGH (ref 22–32)
CO2: 37 mmol/L — AB (ref 22–32)
Chloride: 103 mmol/L (ref 98–111)
Creatinine, Ser: 0.76 mg/dL (ref 0.44–1.00)
Creatinine, Ser: 0.9 mg/dL (ref 0.44–1.00)
GFR calc non Af Amer: >60 mL/min (ref >60)
Glucose, Bld: 59 mg/dL — ABNORMAL LOW (ref 70–99)
Glucose, Bld: 214 mg/dL — ABNORMAL HIGH (ref 70–99)
Potassium: 3.6 mmol/L (ref 3.5–5.1)
Potassium: 4 mmol/L (ref 3.5–5.1)
SODIUM: 148 mmol/L — AB (ref 135–145)
Sodium: 147 mmol/L — ABNORMAL HIGH (ref 135–145)

## 2017-10-17 LAB — GLUCOSE, CAPILLARY
GLUCOSE-CAPILLARY: 34 mg/dL — AB (ref 70–99)
GLUCOSE-CAPILLARY: 243 mg/dL — AB (ref 70–99)
Glucose-Capillary: 197 mg/dL — ABNORMAL HIGH (ref 70–99)
Glucose-Capillary: 122 mg/dL — ABNORMAL HIGH (ref 70–99)
Glucose-Capillary: 444 mg/dL — ABNORMAL HIGH (ref 70–99)

## 2017-10-17 LAB — CK: CK TOTAL: 30 U/L — AB (ref 38–234)

## 2017-10-17 LAB — C-REACTIVE PROTEIN: CRP: 1.5 mg/dL — AB (ref <1.0)

## 2017-10-17 LAB — SEDIMENTATION RATE: Sed Rate: 33 mm/hr — ABNORMAL HIGH (ref 0–22)

## 2017-10-17 MED ORDER — INSULIN DETEMIR 100 UNIT/ML ~~LOC~~ SOLN
20.0000 [IU] | Freq: Every day | SUBCUTANEOUS | Status: DC
  Filled 2017-10-17: qty 0.2

## 2017-10-17 MED ORDER — BUSPIRONE HCL 5 MG PO TABS: 5.0000 mg | ORAL_TABLET | Freq: Three times a day (TID) | ORAL | Status: DC

## 2017-10-17 MED ORDER — DEXTROSE 10 % IV SOLN
INTRAVENOUS | Status: DC
  Administered 2017-10-17: 08:00:00 via INTRAVENOUS
  Filled 2017-10-17: qty 1000

## 2017-10-17 MED ORDER — INSULIN ASPART 100 UNIT/ML ~~LOC~~ SOLN
0.0000 [IU] | Freq: Every day | SUBCUTANEOUS | Status: DC
  Administered 2017-10-17: 5 [IU] via SUBCUTANEOUS
  Administered 2017-10-18: 3 [IU] via SUBCUTANEOUS

## 2017-10-17 MED ORDER — INSULIN DETEMIR 100 UNIT/ML ~~LOC~~ SOLN
15.0000 [IU] | Freq: Every day | SUBCUTANEOUS | Status: DC
Start: 2017-10-17 — End: 2017-10-19
  Administered 2017-10-17 – 2017-10-18 (×2): 15 [IU] via SUBCUTANEOUS
  Filled 2017-10-17 (×2): qty 0.15

## 2017-10-17 MED ORDER — FUROSEMIDE 10 MG/ML IJ SOLN
40.0000 mg | Freq: Four times a day (QID) | INTRAMUSCULAR | Status: AC
  Administered 2017-10-17 (×2): 40 mg via INTRAVENOUS
  Filled 2017-10-17 (×2): qty 4

## 2017-10-17 MED ORDER — INSULIN DETEMIR 100 UNIT/ML ~~LOC~~ SOLN
15.0000 [IU] | Freq: Two times a day (BID) | SUBCUTANEOUS | Status: DC
  Filled 2017-10-17: qty 0.15

## 2017-10-17 MED ORDER — POTASSIUM CHLORIDE CRYS ER 20 MEQ PO TBCR
40.0000 meq | EXTENDED_RELEASE_TABLET | ORAL | Status: AC
  Administered 2017-10-17 (×2): 40 meq via ORAL
  Filled 2017-10-17 (×2): qty 2

## 2017-10-17 MED ORDER — DEXTROSE 50 % IV SOLN
1.0000 | Freq: Once | INTRAVENOUS | Status: AC
  Administered 2017-10-17: 50 mL via INTRAVENOUS

## 2017-10-17 MED ORDER — DILTIAZEM HCL ER COATED BEADS 120 MG PO CP24
240.0000 mg | ORAL_CAPSULE | Freq: Every day | ORAL | Status: DC
  Administered 2017-10-17 – 2017-10-19 (×3): 240 mg via ORAL
  Filled 2017-10-17 (×3): qty 2

## 2017-10-17 MED ORDER — DEXTROSE 50 % IV SOLN
INTRAVENOUS | Status: AC
  Filled 2017-10-17: qty 50

## 2017-10-17 NOTE — Progress Notes (Signed)
Hypoglycemic Event  CBG: 34 _0   Treatment: Amp of D50 given and D10_1 /hr started  Symptoms: Drowsy  Follow-up CBG: Time: 0828 CBG Result:197  Possible Reasons for Event: NPO due to BiPAP  Comments/MD notified: Posey Pronto MD at Bedside and notified    Vanessa Alexander

## 2017-10-17 NOTE — Progress Notes (Signed)
PULMONARY / CRITICAL CARE MEDICINE   Name: Razia Screws MRN: [id number] DOB: 30-May-1955    ADMISSION DATE:  10/10/2017 CONSULTATION DATE:  10/12/2017  REFERRING MD:  [place]ville Silos  CHIEF COMPLAINT:  Dyspnea  HISTORY OF PRESENT ILLNESS:   62 y/o female with severe COPD on 6L [place] who has experienced multiple hospitalizations for her lung problems was admitted on 6/27 with acute on chronic respiratory failure with hypoxemia due to strep pneumonia.    PAST MEDICAL HISTORY :  She  has a past medical history of CHF (congestive heart failure) ([place]), COPD (chronic obstructive pulmonary disease) ([place]), Cor pulmonale (chronic) ([place]), Diabetes mellitus without complication ([place]), Hypertension, Paroxysmal atrial fibrillation ([place]), and Pulmonary hypertension ([place]).   SUBJECTIVE:  Managed to come off of BIPAP for several hours yesterday Diuresed some  VITAL SIGNS: BP 126/74   Pulse 79   Temp (!) 96.9 F (36.1 C) (Axillary)   Resp 17   Ht _0  (1.499 m)   Wt 85.7 kg (188 lb 15 oz)   SpO2 92%   BMI 38.16 kg/m   HEMODYNAMICS:    VENTILATOR SETTINGS: FiO2 (%):  [50 %] 50 %  INTAKE / OUTPUT: I/O last 3 completed shifts: In: 740 [P.O.:640; IV Piggyback:100] Out: 1100 [Urine:1100]  PHYSICAL EXAMINATION:  General:  Resting comfortably in bed HENT: NCAT OP clear, remains on BIPAP PULM: Poor air movement B, normal effort CV: Irreg irreg, no mgr GI: BS+, soft, nontender MSK: normal bulk and tone Neuro: sleepy    LABS:  BMET Recent Labs  Lab 10/15/17 0300 10/16/17 0323 10/17/17 0320  NA 143 145 148*  K 4.4 4.2 3.6  CL 99 101 103  CO2 35* 36* 36*  BUN 59* 53* 42*  CREATININE 1.39* 0.97 0.76  GLUCOSE 320* 358* 59*    Electrolytes Recent Labs  Lab 10/14/17 0330 10/15/17 0300 10/16/17 0323 10/17/17 0320  CALCIUM 8.6* 8.8* 8.7* 8.7*  MG 2.5*  --   --   --   PHOS 3.1  --   --   --     CBC Recent Labs  Lab 10/15/17 0300 10/16/17 0323 10/17/17 0320  WBC 10.3  10.0 10.3  HGB 10.5* 10.5* 10.7*  HCT 36.5 36.5 36.9  PLT 175 196 229    Coag's No results for input(s): APTT, INR in the last 168 hours.  Sepsis Markers Recent Labs  Lab 10/10/17 1254  10/11/17 0804 10/12/17 0256 10/13/17 0321  LATICACIDVEN 2.92*  --  1.2  --   --   PROCALCITON  --    < > 2.75 1.81 0.68   < > = values in this interval not displayed.    ABG Recent Labs  Lab 10/10/17 1142 10/10/17 1807  PHART 7.389 7.374  PCO2ART 56.9* 55.8*  PO2ART 68.3* 63.7*    Liver Enzymes Recent Labs  Lab 10/10/17 1134 10/11/17 0311  AST 27 15  ALT 38 34  ALKPHOS 107 90  BILITOT 1.1 0.7  ALBUMIN 3.1* 2.7*    Cardiac Enzymes Recent Labs  Lab 10/10/17 1546 10/10/17 2225 10/11/17 0311  TROPONINI 0.10* 0.10* 0.08*    Glucose Recent Labs  Lab 10/15/17 1644 10/15/17 2143 10/16/17 0740 10/16/17 1156 10/16/17 1628 10/16/17 2146  GLUCAP 205* 433* 243* 272* 212* 247*    Imaging Dg Chest Port 1 View  Result Date: 10/17/2017 CLINICAL DATA:  Respiratory failure. EXAM: PORTABLE CHEST 1 VIEW COMPARISON:  10/16/2017 FINDINGS: Cardiac enlargement. Pulmonary vascularity is normal. Atelectasis in the lung bases. No consolidation.  No blunting of costophrenic angles. No pneumothorax. Mediastinal contours appear intact. No change since previous study. IMPRESSION: Cardiac enlargement. Linear atelectasis in the lung bases. No focal consolidation. Electronically Signed   By: Lucienne Capers M.D.   On: 10/17/2017 05:23     STUDIES:  06/2017 CT chest showed emphysema worse in upper lobes, cardiomegally, pulmonary vascular enlargement 09/2017 CXR images showing emphysema, cardiomegally, pulmonary vascular enlargement, bibasilar infiltrates 7/4 CXR > cephalization, bibasilar infiltrates unchanged  CULTURES: 6/27 blood > strep pneumo 6/30 blood > negative  ANTIBIOTICS: 6/27 vanc > x1 6/27 cefepime > x1 6/28 ceftriaxone >   SIGNIFICANT  EVENTS:   LINES/TUBES:   DISCUSSION: 62 y/o female with advanced chronic respiratory failure with hypercarbia and hypoxemia due to emphysema and CHF admitted for another acute exacerbation of respiratory failure due to strep pneumo pneumonia.    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> no role for vasodilators CAP Acute pulmonary edema P:   Again will move towards more time off of BIPAP, OK to continue in the evenings but the goal is to come off of it during the day as long as tolerated Prednisone to taper off Continue prednisone Continue pulmicort and brovana Goal FiO2 88-94%  CARDIOVASCULAR A:  Afib  Acute diastolic CHF P:  Increase IV lasix frequency to q6h x2 doses today Tele Continue digoxin, metoprolol  INFECTIOUS A:   CAP P:   Continue ceftriaxone> plan 14 days   FAMILY  - Updates: none bedside   Roselie Awkward, MD [place] [facility] Pager: [phone] Cell: [phone] After 3pm or if no response, call [phone]   10/17/2017, 7:23 AM

## 2017-10-17 NOTE — Progress Notes (Addendum)
[facility] Progress Note  Patient: Vanessa Alexander CBU:[id number]   PCP: Townsend Roger, MD DOB: Dec 11, 1955   DOA: 10/10/2017   DOS: 10/17/2017   Date of Service: the patient was seen and examined on 10/17/2017  Subjective: Patient drowsy and lethargic this morning.  Still wants to wear BiPAP throughout the day.  No nausea no vomiting no other acute events.  Brief hospital course: Pt. with PMH of COPD, chronic respiratory failure on 6 L of oxygen, HTN, CHF, type II DM, SVT; admitted on 10/10/2017, presented with complaint of shortness of breath, was found to have pneumonia with acute hypoxic respiratory failure and Streptococcus bacteremia with COPD exacerbation. Currently further plan is continue IV antibiotics and continue to minimize BiPAP use during the day.  Assessment and Plan: 1.  Acute on chronic hypoxic respiratory failure. Acute COPD exacerbation. Acute on chronic diastolic CHF. Streptococcus bacteremia. Bilateral pneumonia secondary to Streptococcus pneumoniae. Presented with shortness of breath, requiring BiPAP. Uses BiPAP at night but using BiPAP throughout the day here. Stable on nonrebreather but desats on high flow nasal cannula. Started with IV vancomycin and IV cefepime, blood cultures were positive for Streptococcus pneumoniae.  Infectious disease was consulted. Currently on IV ceftriaxone only, ID recommends to complete 10-day treatment course, PCCM recommends 14-day treatment course. Patient was also given IV Solu-Medrol, now titrating down to oral prednisone. Given high-dose IV Lasix initially with worsening renal function transition back to oral Lasix. Feels that the patient requires higher dose of Lasix.  Still receiving IV for today and will transition to oral tomorrow. Palliative care consulted for goals of care discussion since patient has large component of anxiety contributing to her respiratory distress. Echocardiogram April 2019 shows preserved EF,  diastolic dysfunction, severe right-sided dysfunction as well as pulmonary hypertension. Continue Mucinex, duo nebs, pulmonary toilet, Pulmicort and Brovana.  2.  Acute kidney injury. Hyperkalemia Multifactorial, initially cardiorenal later on with diuresis. Now renal function back to normal. Monitor. Potassium corrected with Kayexalate.  3.  Elevated troponin. HTN. SVT Due to demand ischemia from acute hypoxia as well as CHF exacerbation. Continue Cardizem, Toprol-XL and Lasix. Heart rate currently appears to have well controlled. PRN IV Lopressor for SVTs.  4.  Type 2 diabetes mellitus, controlled. Prior hemoglobin A1c 7.2. Currently uncontrolled in the hospital with hyper and hypoglycemia. Patient blood sugar was significantly elevated due to steroids requiring Levemir 20 mg twice a day as well as a resistant sliding scale as well as scheduled pre-meal coverage. Now that the patient is on 20 mg prednisone her insulin requirement has gone down and the patient had an episode of hypoglycemia on 10/17/2017. Will decrease Levemir to 20 mg daily, continue sliding scale insulin and discontinue pre-meal coverage for now. Patient is currently on D10 at 50 cc/h until she is more awake and able to eat.  5.  Anxiety Mood disorder. Remeron increased to 30 mg nightly. Benzodiazepine switch to Klonopin.  6.  Respiratory distress. Palliative care consulted. Morphine dose adjusted although the patient still is not using this medication. Prefer to use this medication if the patient is stable enough to use it during the daytime for respiratory distress as opposed to going back on BiPAP.  7.  Goals of care discussion. Palliative care consulted, appreciate their assistance.  Addendum: 8. Bilateral leg weakness RN called to notify bilateral leg weakness that she noted on shift check I evaluated the pt at bedside, pt has atleast 4/5 bilateral leg strength without any other focal deficit. This  appear unchanged from my prior eval.  A STAT CT HEAD also negative for any acute abnormality  Likely due to pt receiving morphine and Klonopin causing her to have generalized weakness.  Will reduce the dose of buspar as the pt is on other narcotics and Benzo for symptom control.  Serial neurochecks  Check BMP, Mg, CK.   9. Sacral ulcers Pt has multiple small punched out ulcers on her sacrum.  None of them appear infected.  Appears to present on admission  Check ESR CRP RPR Wound care consult.    Berle Mull 3:43 PM 10/17/2017    Diet: Healthy carb modified diet DVT Prophylaxis: subcutaneous Heparin  Advance goals of care discussion: full code  Family Communication: no family was present at bedside, at the time of interview.   Disposition:  Discharge to be determined.  Consultants: Palliative care  PCCM  Procedures: BIPAP  Antibiotics: Anti-infectives (From admission, onward)   Start     Dose/Rate Route Frequency Ordered Stop   10/12/17 0000  vancomycin (VANCOCIN) IVPB 750 mg/150 ml premix  Status:  Discontinued     750 mg 150 mL/hr over 60 Minutes Intravenous Every 36 hours 10/10/17 1532 10/11/17 1010   10/11/17 1000  cefTRIAXone (ROCEPHIN) 2 g in sodium chloride 0.9 % 100 mL IVPB     2 g 200 mL/hr over 30 Minutes Intravenous Every 24 hours 10/11/17 0810     10/10/17 2200  ceFEPIme (MAXIPIME) 2 g in sodium chloride 0.9 % 100 mL IVPB  Status:  Discontinued     2 g 200 mL/hr over 30 Minutes Intravenous Every 12 hours 10/10/17 1532 10/11/17 0810   10/10/17 1230  ceFEPIme (MAXIPIME) 2 g in sodium chloride 0.9 % 100 mL IVPB     2 g 200 mL/hr over 30 Minutes Intravenous  Once 10/10/17 1225 10/10/17 1312   10/10/17 1230  vancomycin (VANCOCIN) 1,500 mg in sodium chloride 0.9 % 500 mL IVPB     1,500 mg 250 mL/hr over 120 Minutes Intravenous  Once 10/10/17 1225 10/10/17 1604       Objective: Physical Exam: Vitals:   10/17/17 0800 10/17/17 0900 10/17/17 1000 10/17/17 1100    BP: 123/89 95/63 110/86 113/82  Pulse: 85 74 81 75  Resp: _0 Temp: (!) 97.5 F (36.4 C)     TempSrc: Axillary     SpO2: 97% 95% 94% 92%  Weight:      Height:        Intake/Output Summary (Last 24 hours) at 10/17/2017 1237 Last data filed at 10/17/2017 0115 Gross per 24 hour  Intake 220 ml  Output 1100 ml  Net -880 ml   Filed Weights   10/15/17 0638 10/16/17 0500 10/17/17 0500  Weight: 82 kg (180 lb 12.4 oz) 84.1 kg (185 lb 6.5 oz) 85.7 kg (188 lb 15 oz)   General: Alert, Awake and Oriented to Time, Place and Person. Appear in moderate distress, affect appropriate Eyes: PERRL, Conjunctiva normal ENT: Oral Mucosa clear moist. Neck: difficult to assess JVD, no Abnormal Mass Or lumps Cardiovascular: S1 and S2 Present, no Murmur, Peripheral Pulses Present Respiratory: increased respiratory effort, Bilateral Air entry equal and Decreased, no use of accessory muscle, basal Crackles, expiratory  wheezes Abdomen: Bowel Sound present, Soft and no tenderness, no hernia Skin: no redness, no Rash, no induration Extremities: bilateral Pedal edema, no calf tenderness Neurologic: Grossly no focal neuro deficit. Bilaterally Equal motor strength  Data Reviewed: CBC: Recent Labs  Lab  10/12/17 0256 10/13/17 0321 10/14/17 0330 10/15/17 0300 10/16/17 0323 10/17/17 0320  WBC 13.1* 8.5 7.9 10.3 10.0 10.3  NEUTROABS 12.3* 8.0*  --   --   --   --   HGB 9.8* 10.4* 10.3* 10.5* 10.5* 10.7*  HCT 32.3* 35.6* 35.5* 36.5 36.5 36.9  MCV 95.0 97.0 97.8 97.9 98.1 97.4  PLT 200 189 203 175 196 619   Basic Metabolic Panel: Recent Labs  Lab 10/13/17 0321 10/14/17 0330 10/15/17 0300 10/16/17 0323 10/17/17 0320  NA 144 140 143 145 148*  K 4.2 4.5 4.4 4.2 3.6  CL 98 96* 99 101 103  CO2 35* 35* 35* 36* 36*  GLUCOSE 281* 347* 320* 358* 59*  BUN 46* 51* 59* 53* 42*  CREATININE 0.94 1.05* 1.39* 0.97 0.76  CALCIUM 8.7* 8.6* 8.8* 8.7* 8.7*  MG  --  2.5*  --   --  2.3  PHOS  --  3.1  --    --   --     Liver Function Tests: Recent Labs  Lab 10/11/17 0311  AST 15  ALT 34  ALKPHOS 90  BILITOT 0.7  PROT 6.2*  ALBUMIN 2.7*   No results for input(s): LIPASE, AMYLASE in the last 168 hours. No results for input(s): AMMONIA in the last 168 hours. Coagulation Profile: No results for input(s): INR, PROTIME in the last 168 hours. Cardiac Enzymes: Recent Labs  Lab 10/10/17 1546 10/10/17 2225 10/11/17 0311  TROPONINI 0.10* 0.10* 0.08*   BNP (last 3 results) No results for input(s): PROBNP in the last 8760 hours. CBG: Recent Labs  Lab 10/16/17 1156 10/16/17 1628 10/16/17 2146 10/17/17 0807 10/17/17 0828  GLUCAP 272* 212* 247* 34* 197*   Studies: Dg Chest Port 1 View  Result Date: 10/17/2017 CLINICAL DATA:  Respiratory failure. EXAM: PORTABLE CHEST 1 VIEW COMPARISON:  10/16/2017 FINDINGS: Cardiac enlargement. Pulmonary vascularity is normal. Atelectasis in the lung bases. No consolidation. No blunting of costophrenic angles. No pneumothorax. Mediastinal contours appear intact. No change since previous study. IMPRESSION: Cardiac enlargement. Linear atelectasis in the lung bases. No focal consolidation. Electronically Signed   By: Lucienne Capers M.D.   On: 10/17/2017 05:23    Scheduled Meds: . arformoterol  15 mcg Nebulization BID  . atorvastatin  40 mg Oral Daily  . budesonide (PULMICORT) nebulizer solution  0.5 mg Nebulization BID  . busPIRone  10 mg Oral TID  . chlorhexidine  15 mL Mouth Rinse BID  . digoxin  0.125 mg Oral Daily  . diltiazem  240 mg Oral Daily  . enoxaparin (LOVENOX) injection  40 mg Subcutaneous Q24H  . famotidine  20 mg Oral Q12H  . furosemide  40 mg Intravenous Q6H  . guaiFENesin  1,200 mg Oral BID  . insulin aspart  0-15 Units Subcutaneous TID WC  . insulin detemir  20 Units Subcutaneous QHS  . ipratropium-albuterol  3 mL Nebulization TID  . magnesium oxide  400 mg Oral Q12H  . mouth rinse  15 mL Mouth Rinse q12n4p  . metoprolol  succinate  50 mg Oral Daily  . mirtazapine  30 mg Oral QHS  . pantoprazole  40 mg Oral Q12H  . predniSONE  20 mg Oral Q breakfast   Continuous Infusions: . cefTRIAXone (ROCEPHIN)  IV Stopped (10/17/17 1130)  . dextrose 50 mL/hr at 10/17/17 0817   PRN Meds: acetaminophen **OR** acetaminophen, clonazePAM, guaiFENesin-dextromethorphan, ipratropium-albuterol, metoprolol tartrate, morphine, ondansetron **OR** ondansetron (ZOFRAN) IV, senna-docusate, sodium chloride, witch hazel-glycerin  Time spent: 24  minutes  Author: Berle Mull, MD [facility] Pager: [phone] 10/17/2017 12:37 PM  If 7PM-7AM, please contact night-coverage at [email or web address], password [facility]

## 2017-10-17 NOTE — Progress Notes (Signed)
1050 RT removed the BIPAP and placed the Pt on 10 L salter [place]. Pt's SAT 96%. RT will continue to monitor

## 2017-10-17 NOTE — Progress Notes (Signed)
Daily Progress Note   Patient Name: Vanessa Alexander       Date: 10/17/2017 DOB: 20-Nov-1955  Age: 62 y.o. MRN#: [id number] Attending Physician: Lavina Hamman, MD Primary Care Physician: Nona Dell, Corene Cornea, MD Admit Date: 10/10/2017  Reason for Consultation/Follow-up: Establishing goals of care and Non pain symptom management  Subjective: Sitting in bed eating lunch.  Reports that she is feeling SOB.    We discussed again about trial of low dose opioids to see if dyspnea improves enough that she stops requesting Bipap.  Length of Stay: 7  Current Medications: Scheduled Meds:  . arformoterol  15 mcg Nebulization BID  . atorvastatin  40 mg Oral Daily  . budesonide (PULMICORT) nebulizer solution  0.5 mg Nebulization BID  . busPIRone  10 mg Oral TID  . chlorhexidine  15 mL Mouth Rinse BID  . digoxin  0.125 mg Oral Daily  . diltiazem  240 mg Oral Daily  . enoxaparin (LOVENOX) injection  40 mg Subcutaneous Q24H  . famotidine  20 mg Oral Q12H  . furosemide  40 mg Intravenous Q6H  . guaiFENesin  1,200 mg Oral BID  . insulin aspart  0-15 Units Subcutaneous TID WC  . insulin detemir  20 Units Subcutaneous QHS  . ipratropium-albuterol  3 mL Nebulization TID  . magnesium oxide  400 mg Oral Q12H  . mouth rinse  15 mL Mouth Rinse q12n4p  . metoprolol succinate  50 mg Oral Daily  . mirtazapine  30 mg Oral QHS  . pantoprazole  40 mg Oral Q12H  . predniSONE  20 mg Oral Q breakfast    Continuous Infusions: . cefTRIAXone (ROCEPHIN)  IV Stopped (10/17/17 1130)  . dextrose 50 mL/hr at 10/17/17 0817    PRN Meds: acetaminophen **OR** acetaminophen, clonazePAM, guaiFENesin-dextromethorphan, ipratropium-albuterol, metoprolol tartrate, morphine, ondansetron **OR** ondansetron (ZOFRAN) IV,  senna-docusate, sodium chloride, witch hazel-glycerin  Physical Exam      General: Alert, awake, in bedside chair with NRB in place.  HEENT: No bruits, no goiter, no JVD Heart: Irregular rhythm. No murmur appreciated. Lungs: Coarse in bases, diminished air movement Abdomen: Soft, nontender, nondistended, positive bowel sounds.  Ext: 1+ edema Skin: Warm and dry Neuro: Grossly intact, nonfocal.     Vital Signs: BP 113/82   Pulse 75   Temp (!) 97.5 F (36.4 C) (  Axillary)   Resp 12   Ht _0  (1.499 m)   Wt 85.7 kg (188 lb 15 oz)   SpO2 92%   BMI 38.16 kg/m  SpO2: SpO2: 92 % O2 Device: O2 Device: Bi-PAP O2 Flow Rate: O2 Flow Rate (L/min): 15 L/min  Intake/output summary:   Intake/Output Summary (Last 24 hours) at 10/17/2017 1222 Last data filed at 10/17/2017 0115 Gross per 24 hour  Intake 220 ml  Output 1100 ml  Net -880 ml   LBM: Last BM Date: 10/16/17 Baseline Weight: Weight: 77.1 kg (170 lb) Most recent weight: Weight: 85.7 kg (188 lb 15 oz)       Palliative Assessment/Data:      Patient Active Problem List   Diagnosis Date Noted  . SOB (shortness of breath)   . AF (paroxysmal atrial fibrillation) ([place]) 10/13/2017  . Pneumococcal bacteremia 10/11/2017  . HCAP (healthcare-associated pneumonia) 10/11/2017  . Acute on chronic diastolic CHF (congestive heart failure) ([place]) 10/11/2017  . COPD (chronic obstructive pulmonary disease) ([place]) 10/11/2017  . HTN (hypertension) 10/11/2017  . AKI (acute kidney injury) ([place]) 10/11/2017  . DM (diabetes mellitus) ([place]) 10/11/2017  . Lactic acidosis 10/11/2017  . Elevated troponin 10/11/2017  . Hyperkalemia 10/11/2017  . Acute on chronic respiratory failure ([place]) 10/10/2017    Palliative Care Assessment & Plan   Patient Profile: 62 y.o. female  with past medical history of COPD oxygen been on 6 L, hypertension, CHF, diabetes type 2, SVT with MAT admitted on 10/10/2017 with shortness of breath and COPD exacerbation.   Palliative consulted for goals of care.  Assessment: Patient Active Problem List   Diagnosis Date Noted  . SOB (shortness of breath)   . AF (paroxysmal atrial fibrillation) ([place]) 10/13/2017  . Pneumococcal bacteremia 10/11/2017  . HCAP (healthcare-associated pneumonia) 10/11/2017  . Acute on chronic diastolic CHF (congestive heart failure) ([place]) 10/11/2017  . COPD (chronic obstructive pulmonary disease) ([place]) 10/11/2017  . HTN (hypertension) 10/11/2017  . AKI (acute kidney injury) ([place]) 10/11/2017  . DM (diabetes mellitus) ([place]) 10/11/2017  . Lactic acidosis 10/11/2017  . Elevated troponin 10/11/2017  . Hyperkalemia 10/11/2017  . Acute on chronic respiratory failure ([place]) 10/10/2017    Recommendations/Plan: - For anxiety: Continue Remeron to 36m QHS.  Continue Klonopin 0.258mQ12hours prn, but may need to consider increasing.  Her anxiety is also driven largely by her dyspnea.  See next.  - For dyspnea: She still has not tried morphine to see if it is beneficial for her dyspnea.  Discussed trial of morphine 2.36m61m 36mg43mery4 hours prn to see if this will help with dyspnea.  She has been maintaining sats without Bipap, but continually demands to wear it.  We again discussed plan to trial low dose opioid to see if this provides enough symptom relief of dyspnea to avoid Bipap throughout the day.    - Declined to discuss goals further today. Continue conversation regarding expectations and goals as she is emotionally willing to do so.  Goals of Care and Additional Recommendations:  Limitations on Scope of Treatment: Full Scope Treatment  Code Status:    Code Status Orders  (From admission, onward)        Start     Ordered   10/10/17 1420  Full code  Continuous     10/10/17 1420    Code Status History    Date Active Date Inactive Code Status Order ID Comments User Context   07/26/2017 2033 08/05/2017 1825 Full Code [id number]  Horald Chestnut Inpatient    Advance Directive  Documentation     Most Recent Value  Type of Advance Directive  Healthcare Power of Attorney  Pre-existing out of facility DNR order (yellow form or pink MOST form)  -  "MOST" Form in Place?  -       Prognosis:   Guarded  Discharge Planning:  To Be Determined  Care plan was discussed with patient, RN, Dr. Posey Pronto  Thank you for allowing the Palliative Medicine Team to assist in the care of this patient.   Total Time 30 Prolonged Time Billed No      Greater than 50%  of this time was spent counseling and coordinating care related to the above assessment and plan.  Micheline Rough, MD  Please contact Palliative Medicine Team phone at [phone] for questions and concerns.

## 2017-10-18 ENCOUNTER — Inpatient Hospital Stay (HOSPITAL_COMMUNITY): Payer: Medicare Other

## 2017-10-18 DIAGNOSIS — I361 Nonrheumatic tricuspid (valve) insufficiency: Secondary | ICD-10-CM

## 2017-10-18 LAB — MAGNESIUM: Magnesium: 2 mg/dL (ref 1.7–2.4)

## 2017-10-18 LAB — ECHOCARDIOGRAM LIMITED
Height: 59 in
WEIGHTICAEL: 3022.95 [oz_av]

## 2017-10-18 LAB — BASIC METABOLIC PANEL
Anion gap: 11 (ref 5–15)
BUN: 34 mg/dL — ABNORMAL HIGH (ref 8–23)
CALCIUM: 8.7 mg/dL — AB (ref 8.9–10.3)
CO2: 37 mmol/L — ABNORMAL HIGH (ref 22–32)
Chloride: 100 mmol/L (ref 98–111)
Creatinine, Ser: 0.73 mg/dL (ref 0.44–1.00)
Glucose, Bld: 148 mg/dL — ABNORMAL HIGH (ref 70–99)
Potassium: 4.4 mmol/L (ref 3.5–5.1)
SODIUM: 148 mmol/L — AB (ref 135–145)

## 2017-10-18 LAB — DIGOXIN LEVEL: Digoxin Level: 1.3 ng/mL (ref 0.8–2.0)

## 2017-10-18 LAB — CULTURE, BLOOD (ROUTINE X 2)
Culture: NO GROWTH
Culture: NO GROWTH
SPECIAL REQUESTS: ADEQUATE
Special Requests: ADEQUATE

## 2017-10-18 LAB — GLUCOSE, CAPILLARY
GLUCOSE-CAPILLARY: 158 mg/dL — AB (ref 70–99)
GLUCOSE-CAPILLARY: 169 mg/dL — AB (ref 70–99)
Glucose-Capillary: 130 mg/dL — ABNORMAL HIGH (ref 70–99)
Glucose-Capillary: 136 mg/dL — ABNORMAL HIGH (ref 70–99)
Glucose-Capillary: 92 mg/dL (ref 70–99)

## 2017-10-18 MED ORDER — GERHARDT'S BUTT CREAM
TOPICAL_CREAM | Freq: Three times a day (TID) | CUTANEOUS | Status: DC
  Administered 2017-10-18 – 2017-10-19 (×4): via TOPICAL
  Administered 2017-10-19: 1 via TOPICAL
  Administered 2017-10-20: 10:00:00 via TOPICAL
  Administered 2017-10-20: 1 via TOPICAL
  Administered 2017-10-21 – 2017-10-22 (×3): via TOPICAL
  Filled 2017-10-18: qty 1

## 2017-10-18 MED ORDER — FUROSEMIDE 10 MG/ML IJ SOLN
40.0000 mg | Freq: Every day | INTRAMUSCULAR | Status: AC
  Administered 2017-10-18 – 2017-10-19 (×2): 40 mg via INTRAVENOUS
  Filled 2017-10-18 (×2): qty 4

## 2017-10-18 MED ORDER — FUROSEMIDE 10 MG/ML IJ SOLN
INTRAMUSCULAR | Status: AC
  Filled 2017-10-18: qty 4

## 2017-10-18 MED ORDER — INSULIN ASPART 100 UNIT/ML ~~LOC~~ SOLN: 4.0000 [IU] | Freq: Three times a day (TID) | SUBCUTANEOUS | Status: DC

## 2017-10-18 NOTE — Progress Notes (Signed)
Daily Progress Note   Patient Name: Vanessa Alexander       Date: 10/18/2017 DOB: 24-Jun-1955  Age: 62 y.o. MRN#: [id number] Attending Physician: Lavina Hamman, MD Primary Care Physician: Nona Dell, Corene Cornea, MD Admit Date: 10/10/2017  Reason for Consultation/Follow-up: Establishing goals of care and Non pain symptom management  Subjective: Sitting in bed wearing nasal cannula.    Met with patient in conjunction with Dr. Posey Pronto.  We discussed again about her clinical course, concern that she has chronic diseases that are going to continue to progress.  She remains frustrated with staff pushing her to be more active and work to spend off the [place].  Length of Stay: 8  Current Medications: Scheduled Meds:  . arformoterol  15 mcg Nebulization BID  . atorvastatin  40 mg Oral Daily  . budesonide (PULMICORT) nebulizer solution  0.5 mg Nebulization BID  . chlorhexidine  15 mL Mouth Rinse BID  . digoxin  0.125 mg Oral Daily  . diltiazem  240 mg Oral Daily  . enoxaparin (LOVENOX) injection  40 mg Subcutaneous Q24H  . famotidine  20 mg Oral Q12H  . furosemide      . furosemide  40 mg Intravenous Daily  . Gerhardt's butt cream   Topical TID  . guaiFENesin  1,200 mg Oral BID  . insulin aspart  0-15 Units Subcutaneous TID WC  . insulin aspart  0-5 Units Subcutaneous QHS  . insulin aspart  4 Units Subcutaneous TID WC  . insulin detemir  15 Units Subcutaneous QHS  . ipratropium-albuterol  3 mL Nebulization TID  . magnesium oxide  400 mg Oral Q12H  . mouth rinse  15 mL Mouth Rinse q12n4p  . metoprolol succinate  50 mg Oral Daily  . mirtazapine  30 mg Oral QHS  . pantoprazole  40 mg Oral Q12H    Continuous Infusions: . cefTRIAXone (ROCEPHIN)  IV Stopped (10/18/17 1215)    PRN  Meds: acetaminophen **OR** acetaminophen, clonazePAM, guaiFENesin-dextromethorphan, ipratropium-albuterol, metoprolol tartrate, morphine, ondansetron **OR** ondansetron (ZOFRAN) IV, senna-docusate, sodium chloride  Physical Exam      General: Alert, awake, in bed with [place] in place.  HEENT: No bruits, no goiter, no JVD Heart: Irregular rhythm. No murmur appreciated. Lungs: Coarse in bases, diminished air movement Abdomen: Soft, nontender, nondistended, positive bowel sounds.  Ext: 1+  edema Skin: Warm and dry Neuro: Grossly intact, nonfocal.     Vital Signs: BP (!) 93/47   Pulse 87   Temp 98 F (36.7 C) (Oral)   Resp (!) 21   Ht _0  (1.499 m)   Wt 85.7 kg (188 lb 15 oz)   SpO2 (!) 88%   BMI 38.16 kg/m  SpO2: SpO2: (!) 88 % O2 Device: O2 Device: High Flow Nasal Cannula O2 Flow Rate: O2 Flow Rate (L/min): 9 L/min  Intake/output summary:   Intake/Output Summary (Last 24 hours) at 10/18/2017 1910 Last data filed at 10/18/2017 1630 Gross per 24 hour  Intake 340 ml  Output 1450 ml  Net -1110 ml   LBM: Last BM Date: 10/17/17 Baseline Weight: Weight: 77.1 kg (170 lb) Most recent weight: Weight: 85.7 kg (188 lb 15 oz)       Palliative Assessment/Data:      Patient Active Problem List   Diagnosis Date Noted  . SOB (shortness of breath)   . AF (paroxysmal atrial fibrillation) ([place]) 10/13/2017  . Pneumococcal bacteremia 10/11/2017  . HCAP (healthcare-associated pneumonia) 10/11/2017  . Acute on chronic diastolic CHF (congestive heart failure) ([place]) 10/11/2017  . COPD (chronic obstructive pulmonary disease) ([place]) 10/11/2017  . HTN (hypertension) 10/11/2017  . AKI (acute kidney injury) ([place]) 10/11/2017  . DM (diabetes mellitus) ([place]) 10/11/2017  . Lactic acidosis 10/11/2017  . Elevated troponin 10/11/2017  . Hyperkalemia 10/11/2017  . Acute on chronic respiratory failure ([place]) 10/10/2017    Palliative Care Assessment & Plan   Patient Profile: 62 y.o. female  with  past medical history of COPD oxygen been on 6 L, hypertension, CHF, diabetes type 2, SVT with MAT admitted on 10/10/2017 with shortness of breath and COPD exacerbation.  Palliative consulted for goals of care.  Assessment: Patient Active Problem List   Diagnosis Date Noted  . SOB (shortness of breath)   . AF (paroxysmal atrial fibrillation) ([place]) 10/13/2017  . Pneumococcal bacteremia 10/11/2017  . HCAP (healthcare-associated pneumonia) 10/11/2017  . Acute on chronic diastolic CHF (congestive heart failure) ([place]) 10/11/2017  . COPD (chronic obstructive pulmonary disease) ([place]) 10/11/2017  . HTN (hypertension) 10/11/2017  . AKI (acute kidney injury) ([place]) 10/11/2017  . DM (diabetes mellitus) ([place]) 10/11/2017  . Lactic acidosis 10/11/2017  . Elevated troponin 10/11/2017  . Hyperkalemia 10/11/2017  . Acute on chronic respiratory failure ([place]) 10/10/2017    Recommendations/Plan: - For anxiety: Continue Remeron to 76m QHS.  Continue Klonopin 0.273mQ12hours prn, but may need to consider increasing.  Her anxiety is also driven largely by her dyspnea.  See next.  - For dyspnea: Morphine 2.20m36m 20mg91mery4 hours prn to see if this will help with dyspnea.  She has been maintaining sats without Bipap, but continually demands to wear it.    - Continue to progress conversation regarding expectations and goals as she is emotionally willing to do so.  Her daughter is coming for a family meeting tomorrow at [id number] of Care and Additional Recommendations:  Limitations on Scope of Treatment: Full Scope Treatment  Code Status:    Code Status Orders  (From admission, onward)        Start     Ordered   10/10/17 1420  Full code  Continuous     10/10/17 1420    Code Status History    Date Active Date Inactive Code Status Order ID Comments User Context   07/26/2017 2033 08/05/2017 1825 Full Code [id number] Chestnut  Inpatient    Advance Directive Documentation     Most Recent Value   Type of Advance Directive  Healthcare Power of Attorney  Pre-existing out of facility DNR order (yellow form or pink MOST form)  -  "MOST" Form in Place?  -       Prognosis:   Guarded  Discharge Planning:  To Be Determined  Care plan was discussed with patient, RN, Dr. Posey Pronto  Thank you for allowing the Palliative Medicine Team to assist in the care of this patient.   Total Time 30 Prolonged Time Billed No      Greater than 50%  of this time was spent counseling and coordinating care related to the above assessment and plan.  Micheline Rough, MD  Please contact Palliative Medicine Team phone at [phone] for questions and concerns.

## 2017-10-18 NOTE — Progress Notes (Addendum)
PULMONARY / CRITICAL CARE MEDICINE   Name: Vanessa Alexander MRN: [id number] DOB: 1955-09-03    ADMISSION DATE:  10/10/2017 CONSULTATION DATE:  10/12/2017  REFERRING MD:  Grandville Silos  CHIEF COMPLAINT:  Dyspnea  HISTORY OF PRESENT ILLNESS:   62 y/o female with severe COPD on 6L [place] who has experienced multiple hospitalizations for her lung problems was admitted on 6/27 with acute on chronic respiratory failure with hypoxemia due to strep pneumonia.    PAST MEDICAL HISTORY :  She  has a past medical history of CHF (congestive heart failure) ([place]), COPD (chronic obstructive pulmonary disease) ([place]), Cor pulmonale (chronic) ([place]), Diabetes mellitus without complication ([place]), Hypertension, Paroxysmal atrial fibrillation ([place]), and Pulmonary hypertension ([place]).   SUBJECTIVE: I/O - net neg 1500 in last 24 hours / -363 for admit.  Afebrile. RT reports pt remained off bipap for several hours yesterday but required 15L flow on HFNC.  Pt met with palliative care on 7/4 but did not want to discuss goals of care. Received morphine for dyspnea and became difficult to arouse > CT head negative.  Mental status resolved.  VITAL SIGNS: BP 110/75   Pulse 78   Temp (!) 96.8 F (36 C) (Axillary)   Resp (!) 22   Ht _0  (1.499 m)   Wt 188 lb 15 oz (85.7 kg)   SpO2 91%   BMI 38.16 kg/m   HEMODYNAMICS:    VENTILATOR SETTINGS: FiO2 (%):  [50 %] 50 %  INTAKE / OUTPUT: I/O last 3 completed shifts: In: 0  Out: 1800 [Urine:1800]  PHYSICAL EXAMINATION: General: adult female in NAD, lying in bed on BiPAP HEENT: MM pink/moist, BiPAP mask in place Neuro:  Awakens to voice, AAOx4, speech clear, MAE CV: s1s2 rrr, no m/r/g PULM: even/non-labored, lungs bilaterally diminished  IR:JJOA, non-tender, bsx4 active  Extremities: warm/dry, 1+ BLE pitting edema (improved) Skin: no rashes or lesions  LABS:  BMET Recent Labs  Lab 10/16/17 0323 10/17/17 0320 10/17/17 1502  NA 145 148* 147*  K 4.2 3.6 4.0   CL 101 103 97*  CO2 36* 36* 37*  BUN 53* 42* 36*  CREATININE 0.97 0.76 0.90  GLUCOSE 358* 59* 214*    Electrolytes Recent Labs  Lab 10/14/17 0330  10/16/17 0323 10/17/17 0320 10/17/17 1502  CALCIUM 8.6*   < > 8.7* 8.7* 8.5*  MG 2.5*  --   --  2.3 2.0  PHOS 3.1  --   --   --   --    < > = values in this interval not displayed.    CBC Recent Labs  Lab 10/15/17 0300 10/16/17 0323 10/17/17 0320  WBC 10.3 10.0 10.3  HGB 10.5* 10.5* 10.7*  HCT 36.5 36.5 36.9  PLT 175 196 229    Coag's No results for input(s): APTT, INR in the last 168 hours.  Sepsis Markers Recent Labs  Lab 10/11/17 0804 10/12/17 0256 10/13/17 0321  LATICACIDVEN 1.2  --   --   PROCALCITON 2.75 1.81 0.68    ABG No results for input(s): PHART, PCO2ART, PO2ART in the last 168 hours.  Liver Enzymes No results for input(s): AST, ALT, ALKPHOS, BILITOT, ALBUMIN in the last 168 hours.  Cardiac Enzymes No results for input(s): TROPONINI, PROBNP in the last 168 hours.  Glucose Recent Labs  Lab 10/17/17 0807 10/17/17 0828 10/17/17 1245 10/17/17 1614 10/17/17 2158 10/18/17 0702  GLUCAP 34* 197* 122* 243* 444* 158*    Imaging Ct Head Wo Contrast  Result Date: 10/17/2017 CLINICAL  DATA:  Fatigue, ataxia EXAM: CT HEAD WITHOUT CONTRAST TECHNIQUE: Contiguous axial images were obtained from the base of the skull through the vertex without intravenous contrast. COMPARISON:  None. FINDINGS: Brain: No acute intracranial abnormality. Specifically, no hemorrhage, hydrocephalus, mass lesion, acute infarction, or significant intracranial injury. Vascular: No hyperdense vessel or unexpected calcification. Skull: No acute calvarial abnormality. Sinuses/Orbits: No acute findings Other: None IMPRESSION: No acute intracranial abnormality. Electronically Signed   By: Rolm Baptise M.D.   On: 10/17/2017 15:27    STUDIES:  06/2017 CT chest showed emphysema worse in upper lobes, cardiomegally, pulmonary vascular  enlargement 09/2017 CXR images showing emphysema, cardiomegally, pulmonary vascular enlargement, bibasilar infiltrates 7/04 CXR > cephalization, bibasilar infiltrates unchanged 7/04 CT Head >> negative   CULTURES: 6/27 blood > strep pneumo 6/30 blood > negative  ANTIBIOTICS: 6/27 vanc > x1 6/27 cefepime > x1 6/28 ceftriaxone >   SIGNIFICANT EVENTS: 6/27  Admit  6/29  PCCM consulted  7/03  Palliative care consulted   LINES/TUBES:   DISCUSSION: 62 y/o female with advanced chronic respiratory failure with hypercarbia and hypoxemia due to emphysema and CHF admitted for another acute exacerbation of respiratory failure due to strep pneumo pneumonia.    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  Continue ongoing efforts for goals of care.  Appreciate palliative care medicine.    Noe Gens, NP-C [facility] Pgr: [phone] or if no answer [phone] 10/18/2017, 7:55 AM

## 2017-10-18 NOTE — Consult Note (Signed)
[facility] Nurse wound consult note Reason for Consult: ulcer on sacrum Wound type: MASD surrounding perirectal area and bilateral buttocks Pressure Injury POA: NA Measurement:scattered partial and 2 full thickness open areas that are in the gluteal fold around anus and on the buttocks due to exposure to urine and stool Wound bed: clean, pink Drainage (amount, consistency, odor) unable to assess due to location Periwound: intact  Dressing procedure/placement/frequency: DC Tucks pads Add Gerhardt's butt cream for moisture barrier and hydrocortisone for  Progressa bed in place for moisture managment  Discussed POC with patient and bedside nurse.  Re consult if needed, will not follow at this time. Thanks  Kylo Gavin [employer], RN,CWOCN, CNS, [place] [phone])

## 2017-10-18 NOTE — Progress Notes (Signed)
  Echocardiogram 2D Echocardiogram limited has been performed.  Darlina Sicilian M 10/18/2017, 11:06 AM

## 2017-10-18 NOTE — Progress Notes (Signed)
[facility] Progress Note  Patient: Vanessa Alexander IRJ:[id number]   PCP: Townsend Roger, MD DOB: [birth year]   DOA: 10/10/2017   DOS: 10/18/2017   Date of Service: the patient was seen and examined on 10/18/2017  Subjective: feeling better, able to communicate in longer sentences, still very shortnes of breath, no chest pain, no focal dificit  Brief hospital course: Pt. with PMH of COPD, chronic respiratory failure on 6 L of oxygen, HTN, CHF, type II DM, SVT; admitted on 10/10/2017, presented with complaint of shortness of breath, was found to have pneumonia with acute hypoxic respiratory failure and Streptococcus bacteremia with COPD exacerbation. Currently further plan is continue IV antibiotics and continue to minimize BiPAP use during the day.  Assessment and Plan: 1.  Acute on chronic hypoxic respiratory failure. Acute COPD exacerbation. Acute on chronic diastolic CHF. Streptococcus bacteremia. Bilateral pneumonia secondary to Streptococcus pneumoniae. Presented with shortness of breath, requiring BiPAP. Uses BiPAP at night but using BiPAP throughout the day here. Stable on nonrebreather but desats on high flow nasal cannula. Started with IV vancomycin and IV cefepime, blood cultures were positive for Streptococcus pneumoniae.  Infectious disease was consulted. Currently on IV ceftriaxone only, ID recommends to complete 10-day treatment course, PCCM recommends 14-day treatment course. Patient was also given IV Solu-Medrol, now titrating down to oral prednisone. Given high-dose IV Lasix initially with worsening renal function transition back to oral Lasix. Back on IV lasix. Palliative care consulted for goals of care discussion since patient has large component of anxiety contributing to her respiratory distress. Echocardiogram April 2019 shows preserved EF, diastolic dysfunction, severe right-sided dysfunction as well as pulmonary hypertension. Repeat Echocardiogram shows preserved  EF Continue Mucinex, duo nebs, pulmonary toilet, Pulmicort and Brovana.  2.  Acute kidney injury. Hyperkalemia Multifactorial, initially cardiorenal later on with diuresis. Now renal function back to normal. Monitor. Potassium corrected with Kayexalate.  3.  Elevated troponin. HTN. SVT Due to demand ischemia from acute hypoxia as well as CHF exacerbation. Continue Cardizem, Toprol-XL and Lasix.  Cardizem dose increased from 180-240 tolerating it very well.  Digoxin level therapeutic. Heart rate currently appears to have well controlled. PRN IV Lopressor for SVTs.  4.  Type 2 diabetes mellitus, uncontrolled.  No complication.  Long-term steroid use. Prior hemoglobin A1c 7.2. Currently uncontrolled in the hospital with hyper and hypoglycemia. Patient blood sugar was significantly elevated due to steroids requiring Levemir 20 mg twice a day as well as a resistant sliding scale as well as scheduled pre-meal coverage. Now that the patient is on 20 mg prednisone her insulin requirement has gone down and the patient had an episode of hypoglycemia on 10/17/2017. Will decrease Levemir to 20 mg daily, continue sliding scale insulin and added los dose pre-meal coverage for now.  5.  Anxiety Mood disorder. Remeron increased to 30 mg nightly. Benzodiazepine switch to Klonopin.  6.  Respiratory distress. Palliative care consulted. Morphine dose adjusted although the patient still is not using this medication. Prefer to use this medication if the patient is stable enough to use it during the daytime for respiratory distress as opposed to going back on BiPAP.  7.  Goals of care discussion. Palliative care consulted, appreciate their assistance. Family meeting tomorrow,   8. Bilateral leg weakness RN called to notify bilateral leg weakness that she noted on shift check I evaluated the pt at bedside, pt has atleast 4/5 bilateral leg strength without any other focal deficit. This appear  unchanged from my prior eval.  A STAT CT HEAD also negative for any acute abnormality  Likely due to pt receiving morphine and Klonopin causing her to have generalized weakness.  Will reduce the dose of buspar as the pt is on other narcotics and Benzo for symptom control.   9. Sacral ulcers Pt has multiple small punched out ulcers on her sacrum.  None of them appear infected.  Appears to present on admission  Mildly elevated ESR CRP Wound care consult appreciated,  Continue foam dressing and Gerhardt's butt cream.  Diet: Healthy carb modified diet DVT Prophylaxis: subcutaneous Heparin  Advance goals of care discussion: full code  Family Communication: no family was present at bedside, at the time of interview.  Disposition:  Discharge to be determined.  Consultants: Palliative care  PCCM   Procedures: bipap  Antibiotics: Anti-infectives (From admission, onward)   Start     Dose/Rate Route Frequency Ordered Stop   10/12/17 0000  vancomycin (VANCOCIN) IVPB 750 mg/150 ml premix  Status:  Discontinued     750 mg 150 mL/hr over 60 Minutes Intravenous Every 36 hours 10/10/17 1532 10/11/17 1010   10/11/17 1000  cefTRIAXone (ROCEPHIN) 2 g in sodium chloride 0.9 % 100 mL IVPB     2 g 200 mL/hr over 30 Minutes Intravenous Every 24 hours 10/11/17 0810     10/10/17 2200  ceFEPIme (MAXIPIME) 2 g in sodium chloride 0.9 % 100 mL IVPB  Status:  Discontinued     2 g 200 mL/hr over 30 Minutes Intravenous Every 12 hours 10/10/17 1532 10/11/17 0810   10/10/17 1230  ceFEPIme (MAXIPIME) 2 g in sodium chloride 0.9 % 100 mL IVPB     2 g 200 mL/hr over 30 Minutes Intravenous  Once 10/10/17 1225 10/10/17 1312   10/10/17 1230  vancomycin (VANCOCIN) 1,500 mg in sodium chloride 0.9 % 500 mL IVPB     1,500 mg 250 mL/hr over 120 Minutes Intravenous  Once 10/10/17 1225 10/10/17 1604       Objective: Physical Exam: Vitals:   10/18/17 1146 10/18/17 1400 10/18/17 1500 10/18/17 1622  BP:  (!) 95/52  (!) 88/65 100/64  Pulse:  (!) 140 (!) 101 67  Resp:  (!) 35 (!) 33 11  Temp: 98.2 F (36.8 C)   98 F (36.7 C)  TempSrc: Oral   Oral  SpO2:  91% (!) 89% 96%  Weight:      Height:        Intake/Output Summary (Last 24 hours) at 10/18/2017 1637 Last data filed at 10/18/2017 1400 Gross per 24 hour  Intake 100 ml  Output 1000 ml  Net -900 ml   Filed Weights   10/15/17 0638 10/16/17 0500 10/17/17 0500  Weight: 82 kg (180 lb 12.4 oz) 84.1 kg (185 lb 6.5 oz) 85.7 kg (188 lb 15 oz)   General: Alert, Awake and Oriented to Time, Place and Person. Appear in no distress, affect appropriate Eyes: PERRL, Conjunctiva normal ENT: Oral Mucosa clear moist. Neck: Positive JVD, no Abnormal Mass Or lumps Cardiovascular: S1 and S2 Present, aortic systolic Murmur, Peripheral Pulses Present Respiratory: increased respiratory effort, Bilateral Air entry equal and Decreased, positive use of accessory muscle, bilateral Crackles, expiratory  wheezes Abdomen: Bowel Sound present, Soft and no tenderness, no hernia Skin: no redness, no Rash, no induration Extremities: no Pedal edema, no calf tenderness Neurologic: Grossly no focal neuro deficit. Bilaterally Equal motor strength    Data Reviewed: CBC: Recent Labs  Lab 10/12/17 0256 10/13/17 0321 10/14/17 0330 10/15/17  0300 10/16/17 0323 10/17/17 0320  WBC 13.1* 8.5 7.9 10.3 10.0 10.3  NEUTROABS 12.3* 8.0*  --   --   --   --   HGB 9.8* 10.4* 10.3* 10.5* 10.5* 10.7*  HCT 32.3* 35.6* 35.5* 36.5 36.5 36.9  MCV 95.0 97.0 97.8 97.9 98.1 97.4  PLT 200 189 203 175 196 491   Basic Metabolic Panel: Recent Labs  Lab 10/14/17 0330 10/15/17 0300 10/16/17 0323 10/17/17 0320 10/17/17 1502 10/18/17 0843  NA 140 143 145 148* 147* 148*  K 4.5 4.4 4.2 3.6 4.0 4.4  CL 96* 99 101 103 97* 100  CO2 35* 35* 36* 36* 37* 37*  GLUCOSE 347* 320* 358* 59* 214* 148*  BUN 51* 59* 53* 42* 36* 34*  CREATININE 1.05* 1.39* 0.97 0.76 0.90 0.73  CALCIUM 8.6* 8.8* 8.7*  8.7* 8.5* 8.7*  MG 2.5*  --   --  2.3 2.0 2.0  PHOS 3.1  --   --   --   --   --     Liver Function Tests: No results for input(s): AST, ALT, ALKPHOS, BILITOT, PROT, ALBUMIN in the last 168 hours. No results for input(s): LIPASE, AMYLASE in the last 168 hours. No results for input(s): AMMONIA in the last 168 hours. Coagulation Profile: No results for input(s): INR, PROTIME in the last 168 hours. Cardiac Enzymes: Recent Labs  Lab 10/17/17 1502  CKTOTAL 30*   BNP (last 3 results) No results for input(s): PROBNP in the last 8760 hours. CBG: Recent Labs  Lab 10/17/17 2158 10/18/17 0702 10/18/17 0759 10/18/17 1340 10/18/17 1621  GLUCAP 444* 158* 130* 136* 92   Studies: No results found.  Scheduled Meds: . arformoterol  15 mcg Nebulization BID  . atorvastatin  40 mg Oral Daily  . budesonide (PULMICORT) nebulizer solution  0.5 mg Nebulization BID  . chlorhexidine  15 mL Mouth Rinse BID  . digoxin  0.125 mg Oral Daily  . diltiazem  240 mg Oral Daily  . enoxaparin (LOVENOX) injection  40 mg Subcutaneous Q24H  . famotidine  20 mg Oral Q12H  . furosemide      . furosemide  40 mg Intravenous Daily  . Gerhardt's butt cream   Topical TID  . guaiFENesin  1,200 mg Oral BID  . insulin aspart  0-15 Units Subcutaneous TID WC  . insulin aspart  0-5 Units Subcutaneous QHS  . insulin aspart  4 Units Subcutaneous TID WC  . insulin detemir  15 Units Subcutaneous QHS  . ipratropium-albuterol  3 mL Nebulization TID  . magnesium oxide  400 mg Oral Q12H  . mouth rinse  15 mL Mouth Rinse q12n4p  . metoprolol succinate  50 mg Oral Daily  . mirtazapine  30 mg Oral QHS  . pantoprazole  40 mg Oral Q12H   Continuous Infusions: . cefTRIAXone (ROCEPHIN)  IV Stopped (10/18/17 1215)   PRN Meds: acetaminophen **OR** acetaminophen, clonazePAM, guaiFENesin-dextromethorphan, ipratropium-albuterol, metoprolol tartrate, morphine, ondansetron **OR** ondansetron (ZOFRAN) IV, senna-docusate, sodium  chloride  Time spent: 35 minutes  Author: Berle Mull, MD [facility] Pager: [phone] 10/18/2017 4:37 PM  If 7PM-7AM, please contact night-coverage at [email or web address], password [facility]

## 2017-10-19 ENCOUNTER — Inpatient Hospital Stay (HOSPITAL_COMMUNITY): Payer: Medicare Other

## 2017-10-19 DIAGNOSIS — Z7189 Other specified counseling: Secondary | ICD-10-CM

## 2017-10-19 DIAGNOSIS — Z515 Encounter for palliative care: Secondary | ICD-10-CM

## 2017-10-19 LAB — BASIC METABOLIC PANEL
Anion gap: 11 (ref 5–15)
BUN: 34 mg/dL — ABNORMAL HIGH (ref 8–23)
CHLORIDE: 99 mmol/L (ref 98–111)
CO2: 37 mmol/L — AB (ref 22–32)
CREATININE: 0.8 mg/dL (ref 0.44–1.00)
Calcium: 8.6 mg/dL — ABNORMAL LOW (ref 8.9–10.3)
GFR calc non Af Amer: >60 mL/min (ref >60)
Glucose, Bld: 73 mg/dL (ref 70–99)
POTASSIUM: 4.2 mmol/L (ref 3.5–5.1)
Sodium: 147 mmol/L — ABNORMAL HIGH (ref 135–145)

## 2017-10-19 LAB — RPR: RPR Ser Ql: NONREACTIVE

## 2017-10-19 LAB — GLUCOSE, CAPILLARY
GLUCOSE-CAPILLARY: 53 mg/dL — AB (ref 70–99)
Glucose-Capillary: 111 mg/dL — ABNORMAL HIGH (ref 70–99)
Glucose-Capillary: 175 mg/dL — ABNORMAL HIGH (ref 70–99)
Glucose-Capillary: 159 mg/dL — ABNORMAL HIGH (ref 70–99)
Glucose-Capillary: 88 mg/dL (ref 70–99)

## 2017-10-19 LAB — CBC
HEMATOCRIT: 39.3 % (ref 36.0–46.0)
HEMOGLOBIN: 11.3 g/dL — AB (ref 12.0–15.0)
MCH: 28.5 pg (ref 26.0–34.0)
MCHC: 28.8 g/dL — AB (ref 30.0–36.0)
MCV: 99.2 fL (ref 78.0–100.0)
Platelets: 249 10*3/uL (ref 150–400)
RBC: 3.96 MIL/uL (ref 3.87–5.11)
RDW: 17.3 % — ABNORMAL HIGH (ref 11.5–15.5)
WBC: 10 10*3/uL (ref 4.0–10.5)

## 2017-10-19 LAB — MAGNESIUM: Magnesium: 2.2 mg/dL (ref 1.7–2.4)

## 2017-10-19 MED ORDER — DEXTROSE 50 % IV SOLN
INTRAVENOUS | Status: AC
  Filled 2017-10-19: qty 50

## 2017-10-19 MED ORDER — DEXTROSE 50 % IV SOLN
25.0000 mL | Freq: Once | INTRAVENOUS | Status: AC
  Administered 2017-10-19: 25 mL via INTRAVENOUS

## 2017-10-19 MED ORDER — FUROSEMIDE 40 MG PO TABS: 40.0000 mg | ORAL_TABLET | Freq: Every day | ORAL | Status: DC

## 2017-10-19 NOTE — Care Management Important Message (Signed)
Important Message  Patient Details  Name: Vanessa Alexander MRN: [id number] Date of Birth: 1955-09-12   Medicare Important Message Given:  Yes    Erenest Rasher, RN 10/19/2017, 5:12 PM

## 2017-10-19 NOTE — Progress Notes (Signed)
10/19/17 1700  Clinical Encounter Type  Visited With Patient  Visit Type Initial;Psychological support;Social support  Referral From Nurse  Consult/Referral To Chaplain  Spiritual Encounters  Spiritual Needs Emotional  Stress Factors  Patient Stress Factors None identified   Chaplain was asked to visit with Vanessa Alexander, However when chaplain arrived the Pt was sleep and didn't reaction much to my voice. There were no family at bedside. After speaking with the Pt's RN  It was decided that the chaplain should let the Pt rest. Chaplain will try again in the morning.

## 2017-10-19 NOTE — Progress Notes (Signed)
Hypoglycemic Event  CBG: 53  Treatment: D50 IV 25 mL  Symptoms: None  Follow-up CBG: Time:0805 CBG Result: 111  Possible Reasons for Event: medications vs. Minimal food intake  Comments/MD notified: Dr. Posey Pronto paged    Charleen Kirks

## 2017-10-19 NOTE — Progress Notes (Addendum)
PULMONARY / CRITICAL CARE MEDICINE   Name: Vanessa Alexander MRN: [id number] DOB: [birth year]    ADMISSION DATE:  10/10/2017 CONSULTATION DATE:  10/12/2017  REFERRING MD:  Grandville Silos  CHIEF COMPLAINT:  Dyspnea  HISTORY OF PRESENT ILLNESS:   62 y/o female with severe COPD on 6L [place] who has experienced multiple hospitalizations for her lung problems was admitted on 6/27 with acute on chronic respiratory failure with hypoxemia due to strep pneumonia.    PAST MEDICAL HISTORY :  She  has a past medical history of CHF (congestive heart failure) ([place]), COPD (chronic obstructive pulmonary disease) ([place]), Cor pulmonale (chronic) ([place]), Diabetes mellitus without complication ([place]), Hypertension, Paroxysmal atrial fibrillation ([place]), and Pulmonary hypertension ([place]).   SUBJECTIVE:  BIPAP most of the day this morning Family conversation today, see below  VITAL SIGNS: BP 92/60   Pulse 97   Temp (!) 97.5 F (36.4 C) (Axillary)   Resp 20   Ht _0  (1.499 m)   Wt 192 lb 3.9 oz (87.2 kg)   SpO2 90%   BMI 38.83 kg/m   HEMODYNAMICS:    VENTILATOR SETTINGS: FiO2 (%):  [60 %] 60 %  INTAKE / OUTPUT: I/O last 3 completed shifts: In: 340 [P.O.:240; IV Piggyback:100] Out: 1650 [Urine:1650]  PHYSICAL EXAMINATION:  General:  Resting comfortably in bed HENT: NCAT BIPAP mask in place PULM: poor air movement but no wheezing, normal effort CV: RRR, no mgr GI: BS+, soft, nontender MSK: normal bulk and tone Neuro: awake, alert, no distress, MAEW    LABS:  BMET Recent Labs  Lab 10/17/17 1502 10/18/17 0843 10/19/17 0352  NA 147* 148* 147*  K 4.0 4.4 4.2  CL 97* 100 99  CO2 37* 37* 37*  BUN 36* 34* 34*  CREATININE 0.90 0.73 0.80  GLUCOSE 214* 148* 73    Electrolytes Recent Labs  Lab 10/14/17 0330  10/17/17 1502 10/18/17 0843 10/19/17 0352  CALCIUM 8.6*   < > 8.5* 8.7* 8.6*  MG 2.5*   < > 2.0 2.0 2.2  PHOS 3.1  --   --   --   --    < > = values in this interval not  displayed.    CBC Recent Labs  Lab 10/16/17 0323 10/17/17 0320 10/19/17 0352  WBC 10.0 10.3 10.0  HGB 10.5* 10.7* 11.3*  HCT 36.5 36.9 39.3  PLT 196 229 249    Coag's No results for input(s): APTT, INR in the last 168 hours.  Sepsis Markers Recent Labs  Lab 10/13/17 0321  PROCALCITON 0.68    ABG No results for input(s): PHART, PCO2ART, PO2ART in the last 168 hours.  Liver Enzymes No results for input(s): AST, ALT, ALKPHOS, BILITOT, ALBUMIN in the last 168 hours.  Cardiac Enzymes No results for input(s): TROPONINI, PROBNP in the last 168 hours.  Glucose Recent Labs  Lab 10/18/17 1340 10/18/17 1621 10/18/17 2100 10/19/17 0739 10/19/17 0806 10/19/17 1146  GLUCAP 136* 92 169* 53* 111* 175*    Imaging Dg Chest Port 1 View  Result Date: 10/19/2017 CLINICAL DATA:  Acute respiratory failure with hypoxia. EXAM: PORTABLE CHEST 1 VIEW COMPARISON:  10/17/2017. FINDINGS: The heart is enlarged. There is mild vascular congestion without consolidation or edema. No effusion or pneumothorax. Osteopenia. Compared with prior radiograph, slight improvement in aeration and slight clearing of the linear atelectasis. IMPRESSION: Cardiomegaly.  No significant consolidation or edema. Electronically Signed   By: Staci Righter M.D.   On: 10/19/2017 07:03     STUDIES:  06/2017 CT chest showed emphysema worse in upper lobes, cardiomegally, pulmonary vascular enlargement 09/2017 CXR images showing emphysema, cardiomegally, pulmonary vascular enlargement, bibasilar infiltrates 7/4 CXR > cephalization, bibasilar infiltrates unchanged  CULTURES: 6/27 blood > strep pneumo 6/30 blood > negative  ANTIBIOTICS: 6/27 vanc > x1 6/27 cefepime > x1 6/28 ceftriaxone >   SIGNIFICANT EVENTS:   LINES/TUBES:   DISCUSSION: 62 y/o female with advanced chronic respiratory failure with hypercarbia and hypoxemia due to emphysema and CHF admitted for another acute exacerbation of respiratory failure  due to strep pneumo pneumonia.    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 Posey Pronto and Domingo Cocking.  Conversation lead by Dr. Domingo Cocking.  Plan is home with hospice, use BIPAP prn at home as she depends on it most of the day for both psychological and breathing support (sometimes here this week more the former than the latter).  CODE STATUS DNR.   Roselie Awkward, MD [place] [facility] Pager: [phone] Cell: [phone] After 3pm or if no response, call [phone]   10/19/2017, 1:16 PM

## 2017-10-19 NOTE — Progress Notes (Signed)
[facility] Progress Note  Patient: Vanessa Alexander ZOX:[id number]   PCP: Townsend Roger, MD DOB: 03-Nov-1955   DOA: 10/10/2017   DOS: 10/19/2017   Date of Service: the patient was seen and examined on 10/19/2017  Subjective: About the same, still remains short of breath.  No chest pain no nausea no vomiting.  Her main complaint is fatigue and anxiety.  Brief hospital course: Pt. with PMH of COPD, chronic respiratory failure on 6 L of oxygen, HTN, CHF, type II DM, SVT; admitted on 10/10/2017, presented with complaint of shortness of breath, was found to have pneumonia with acute hypoxic respiratory failure and Streptococcus bacteremia with COPD exacerbation. Currently further plan is  provide comfort and BiPAP as needed and arrange for outpatient home hospice with BiPAP  Assessment and Plan: 1.  Acute on chronic hypoxic respiratory failure. Acute COPD exacerbation. Acute on chronic diastolic CHF. Streptococcus bacteremia. Bilateral pneumonia secondary to Streptococcus pneumoniae. Presented with shortness of breath, requiring BiPAP. Uses BiPAP at night but using BiPAP throughout the day here. Stable on nonrebreather but desats on high flow nasal cannula. Started with IV vancomycin and IV cefepime, blood cultures were positive for Streptococcus pneumoniae.  Infectious disease was consulted. Currently on IV ceftriaxone only, ID recommends to complete 10-day treatment course, PCCM recommends 14-day treatment course.  We will continue while patient is in the hospital.  Last day 10/23/2017. Patient was also given IV Solu-Medrol, now titrating down to oral prednisone. Given high-dose IV Lasix initially with worsening renal function transition back to oral Lasix. Back on IV lasix. Switch to oral 10/19/2017. Palliative care consulted for goals of care discussion since patient has large component of anxiety contributing to her respiratory distress. Echocardiogram April 2019 shows preserved EF,  diastolic dysfunction, severe right-sided dysfunction as well as pulmonary hypertension. Repeat Echocardiogram shows preserved EF Continue Mucinex, duo nebs, pulmonary toilet, Pulmicort and Brovana.  2.  Acute kidney injury. Hyperkalemia Multifactorial, initially cardiorenal later on with diuresis. Now renal function back to normal. Monitor. Potassium corrected with Kayexalate.  3.  Elevated troponin. HTN. SVT Due to demand ischemia from acute hypoxia as well as CHF exacerbation. Continue Cardizem, Toprol-XL and Lasix.  Cardizem dose increased from 180-240 tolerating it very well.  Digoxin level therapeutic. Heart rate currently appears to have well controlled. PRN IV Lopressor for SVTs.  4.  Type 2 diabetes mellitus, uncontrolled.  No complication.  Long-term steroid use. Prior hemoglobin A1c 7.2. Currently uncontrolled in the hospital with hyper and hypoglycemia. Patient blood sugar was significantly elevated due to steroids requiring Levemir 20 mg twice a day as well as a resistant sliding scale as well as scheduled pre-meal coverage. Now that the patient is on 20 mg prednisone her insulin requirement has gone down and the patient had an episode of hypoglycemia on 10/17/2017 and on 10/19/2017. Discontinue scheduled Levemir and just continue sliding scale for now.  5.  Anxiety Mood disorder. Remeron increased to 30 mg nightly. Benzodiazepine switch to Klonopin.  6.  Respiratory distress. Palliative care consulted. Morphine dose adjusted although the patient still is not using this medication. Prefer to use this medication if the patient is stable enough to use it during the daytime for respiratory distress as opposed to going back on BiPAP.  7.  Goals of care discussion. Palliative care consulted, appreciate their assistance. Family meeting return on 10/19/2017. Patient herself expressed her wishes that she does not want to be resuscitated or intubated. She also requested  that she thinks hospice would  be a better option if they would take BiPAP. CCM, palliative care as well as myself were present during the meeting.  Patient had contacted [facility] in the past, will discuss with them regarding arranging home hospice with BiPAP.  8. Bilateral leg weakness RN called to notify bilateral leg weakness that she noted on shift change I evaluated the pt at bedside, pt has atleast 4/5 bilateral leg strength without any other focal deficit. This appear unchanged from my prior eval.  A STAT CT HEAD also negative for any acute abnormality  Likely due to pt receiving morphine and Klonopin causing her to have generalized weakness.  Discontinued buspar  9. Sacral ulcers Pt has multiple small punched out ulcers on her sacrum.  None of them appear infected.  Appears to present on admission  Mildly elevated ESR CRP Wound care consult appreciated,  Continue foam dressing and Gerhardt's butt cream.  Diet: Healthy carb modified diet DVT Prophylaxis: subcutaneous Heparin  Advance goals of care discussion: full code  Family Communication: family was present at bedside, at the time of interview.  Patient allowed permission discussed with the family.  All questions answered.  Disposition:  Discharge to be determined.  Consultants: Palliative care  PCCM   Procedures: bipap  Antibiotics: Anti-infectives (From admission, onward)   Start     Dose/Rate Route Frequency Ordered Stop   10/12/17 0000  vancomycin (VANCOCIN) IVPB 750 mg/150 ml premix  Status:  Discontinued     750 mg 150 mL/hr over 60 Minutes Intravenous Every 36 hours 10/10/17 1532 10/11/17 1010   10/11/17 1000  cefTRIAXone (ROCEPHIN) 2 g in sodium chloride 0.9 % 100 mL IVPB     2 g 200 mL/hr over 30 Minutes Intravenous Every 24 hours 10/11/17 0810     10/10/17 2200  ceFEPIme (MAXIPIME) 2 g in sodium chloride 0.9 % 100 mL IVPB  Status:  Discontinued     2 g 200 mL/hr over 30 Minutes Intravenous  Every 12 hours 10/10/17 1532 10/11/17 0810   10/10/17 1230  ceFEPIme (MAXIPIME) 2 g in sodium chloride 0.9 % 100 mL IVPB     2 g 200 mL/hr over 30 Minutes Intravenous  Once 10/10/17 1225 10/10/17 1312   10/10/17 1230  vancomycin (VANCOCIN) 1,500 mg in sodium chloride 0.9 % 500 mL IVPB     1,500 mg 250 mL/hr over 120 Minutes Intravenous  Once 10/10/17 1225 10/10/17 1604       Objective: Physical Exam: Vitals:   10/19/17 1049 10/19/17 1200 10/19/17 1247 10/19/17 1300  BP:   92/60 111/68  Pulse: 97   94  Resp: _0 Temp:  (!) 97.5 F (36.4 C)    TempSrc:  Axillary    SpO2:   90% 91%  Weight:      Height:        Intake/Output Summary (Last 24 hours) at 10/19/2017 1440 Last data filed at 10/19/2017 1101 Gross per 24 hour  Intake 340 ml  Output 650 ml  Net -310 ml   Filed Weights   10/16/17 0500 10/17/17 0500 10/19/17 0600  Weight: 84.1 kg (185 lb 6.5 oz) 85.7 kg (188 lb 15 oz) 87.2 kg (192 lb 3.9 oz)   General: Alert, Awake and Oriented to Time, Place and Person. Appear in no distress, affect appropriate Eyes: PERRL, Conjunctiva normal ENT: Oral Mucosa clear moist. Neck: Positive JVD, no Abnormal Mass Or lumps Cardiovascular: S1 and S2 Present, aortic systolic Murmur, Peripheral Pulses Present  Respiratory: increased respiratory effort, Bilateral Air entry equal and Decreased, positive use of accessory muscle, bilateral Crackles, expiratory  wheezes Abdomen: Bowel Sound present, Soft and no tenderness, no hernia Skin: no redness, no Rash, no induration, sacral ulcers. Extremities: no Pedal edema, no calf tenderness Neurologic: Grossly no focal neuro deficit. Bilaterally Equal motor strength  Data Reviewed: CBC: Recent Labs  Lab 10/13/17 0321 10/14/17 0330 10/15/17 0300 10/16/17 0323 10/17/17 0320 10/19/17 0352  WBC 8.5 7.9 10.3 10.0 10.3 10.0  NEUTROABS 8.0*  --   --   --   --   --   HGB 10.4* 10.3* 10.5* 10.5* 10.7* 11.3*  HCT 35.6* 35.5* 36.5 36.5 36.9 39.3    MCV 97.0 97.8 97.9 98.1 97.4 99.2  PLT 189 203 175 196 229 956   Basic Metabolic Panel: Recent Labs  Lab 10/14/17 0330  10/16/17 0323 10/17/17 0320 10/17/17 1502 10/18/17 0843 10/19/17 0352  NA 140   < > 145 148* 147* 148* 147*  K 4.5   < > 4.2 3.6 4.0 4.4 4.2  CL 96*   < > 101 103 97* 100 99  CO2 35*   < > 36* 36* 37* 37* 37*  GLUCOSE 347*   < > 358* 59* 214* 148* 73  BUN 51*   < > 53* 42* 36* 34* 34*  CREATININE 1.05*   < > 0.97 0.76 0.90 0.73 0.80  CALCIUM 8.6*   < > 8.7* 8.7* 8.5* 8.7* 8.6*  MG 2.5*  --   --  2.3 2.0 2.0 2.2  PHOS 3.1  --   --   --   --   --   --    < > = values in this interval not displayed.    Liver Function Tests: No results for input(s): AST, ALT, ALKPHOS, BILITOT, PROT, ALBUMIN in the last 168 hours. No results for input(s): LIPASE, AMYLASE in the last 168 hours. No results for input(s): AMMONIA in the last 168 hours. Coagulation Profile: No results for input(s): INR, PROTIME in the last 168 hours. Cardiac Enzymes: Recent Labs  Lab 10/17/17 1502  CKTOTAL 30*   BNP (last 3 results) No results for input(s): PROBNP in the last 8760 hours. CBG: Recent Labs  Lab 10/18/17 1621 10/18/17 2100 10/19/17 0739 10/19/17 0806 10/19/17 1146  GLUCAP 92 169* 53* 111* 175*   Studies: Dg Chest Port 1 View  Result Date: 10/19/2017 CLINICAL DATA:  Acute respiratory failure with hypoxia. EXAM: PORTABLE CHEST 1 VIEW COMPARISON:  10/17/2017. FINDINGS: The heart is enlarged. There is mild vascular congestion without consolidation or edema. No effusion or pneumothorax. Osteopenia. Compared with prior radiograph, slight improvement in aeration and slight clearing of the linear atelectasis. IMPRESSION: Cardiomegaly.  No significant consolidation or edema. Electronically Signed   By: Staci Righter M.D.   On: 10/19/2017 07:03    Scheduled Meds: . arformoterol  15 mcg Nebulization BID  . atorvastatin  40 mg Oral Daily  . budesonide (PULMICORT) nebulizer solution   0.5 mg Nebulization BID  . chlorhexidine  15 mL Mouth Rinse BID  . digoxin  0.125 mg Oral Daily  . diltiazem  240 mg Oral Daily  . enoxaparin (LOVENOX) injection  40 mg Subcutaneous Q24H  . famotidine  20 mg Oral Q12H  . [START ON 10/20/2017] furosemide  40 mg Oral Daily  . Gerhardt's butt cream   Topical TID  . guaiFENesin  1,200 mg Oral BID  . insulin aspart  0-15 Units Subcutaneous TID WC  .  insulin aspart  0-5 Units Subcutaneous QHS  . ipratropium-albuterol  3 mL Nebulization TID  . magnesium oxide  400 mg Oral Q12H  . mouth rinse  15 mL Mouth Rinse q12n4p  . metoprolol succinate  50 mg Oral Daily  . mirtazapine  30 mg Oral QHS  . pantoprazole  40 mg Oral Q12H   Continuous Infusions: . cefTRIAXone (ROCEPHIN)  IV Stopped (10/19/17 1101)   PRN Meds: acetaminophen **OR** acetaminophen, clonazePAM, guaiFENesin-dextromethorphan, ipratropium-albuterol, metoprolol tartrate, morphine, ondansetron **OR** ondansetron (ZOFRAN) IV, senna-docusate, sodium chloride  Time spent: 35 minutes  Author: Berle Mull, MD [facility] Pager: [phone] 10/19/2017 2:40 PM  If 7PM-7AM, please contact night-coverage at [email or web address], password [facility]

## 2017-10-19 NOTE — Progress Notes (Signed)
PT Cancellation Note  Patient Details Name: Quentin Strebel MRN: [id number] DOB: 06/02/1955   Cancelled Treatment:    Reason Eval/Treat Not Completed: Medical issues which prohibited therapy(pt is on Bipap and becomes anxious when placed on nasal canula per RN, RN requested PT hold today. Plan is to DC home with Hospice. )   Philomena Doheny 10/19/2017, 3:17 PM [phone]

## 2017-10-19 NOTE — Care Management Note (Addendum)
Case Management Note  Patient Details  Name: Vanessa Alexander MRN: [id number] Date of Birth: 1955-10-09  Subjective/Objective:    COPD exac, AKI, DM 2                Action/Plan: NCM spoke to pt and offered choice for Home Hospice. Pt preferred [facility]. Referral received that pt's family are requesting [facility]. NCM contacted dtrs, [name] and [name]. Left HIPAA compliant voice mail to return call. NCM contacted [facility] and spoke to [place] with new referral for Home Hospice. On call RN will contact family to arrange for time to meet and arrange DME. Pt has a Trilogy at home. Will need to keep Trilogy or Hospice will need to arrange Bipap.   10/20/2017 0900 NCM spoke to pt's dtr states she has meeting with [name] to set up DME and services for dc possibly Monday or Tuesday.   Expected Discharge Date:              Expected Discharge Plan:  Home w Hospice Care  In-House Referral:  NA  Discharge planning Services  CM Consult  Post Acute Care Choice:  Hospice Choice offered to:  Adult Children  DME Arranged:  Other see comment DME Agency:  [place]:  RN [facility] Agency:  [place]  Status of Service:  Completed, signed off  If discussed at [place] of Stay Meetings, dates discussed:    Additional Comments:  Erenest Rasher, RN 10/19/2017, 4:59 PM

## 2017-10-19 NOTE — Progress Notes (Signed)
Daily Progress Note   Patient Name: Vanessa Alexander       Date: 10/19/2017 DOB: 01-Jan-1956  Age: 62 y.o. MRN#: [id number] Attending Physician: Lavina Hamman, MD Primary Care Physician: Nona Dell, Corene Cornea, MD Admit Date: 10/10/2017  Reason for Consultation/Follow-up: Establishing goals of care and Non pain symptom management  Subjective: Sitting in bed wearing Bipap on entering room.  Transitioned off Bipap without difficulty.    Met with patient in conjunction with Dr. Posey Pronto, Dr. Lake Bells, patient, daughter, and son in law..  We discussed again about her clinical course, concern that she has chronic diseases that are going to continue to progress.  We discussed that in light of multiple chronic medical problems that have worsened with this acute problem, care should be focused on interventions that are likely to allow her to achieve goal of getting back to home and spending time with family. Discussed with her and her daughter regarding heroic interventions at the end-of-life and they agree this would not be in line with prior expressed wishes for a natural death or be likely to lead to getting well enough to go back home. They were in agreement with changing CODE STATUS to DO NOT RESUSCITATE.  We then discussed difference between a aggressive medical intervention path and a palliative, comfort focused care path.  Ms. Vigeant then stated that she thinks that we should work to transition her home with hospice support.  Her daughter reports that they had previously discussed with [facility] [place] and would like to work with them moving forward.   Questions and concerns addressed.   PMT will continue to support holistically.  Length of Stay: 9  Current Medications: Scheduled Meds:  .  arformoterol  15 mcg Nebulization BID  . atorvastatin  40 mg Oral Daily  . budesonide (PULMICORT) nebulizer solution  0.5 mg Nebulization BID  . chlorhexidine  15 mL Mouth Rinse BID  . digoxin  0.125 mg Oral Daily  . diltiazem  240 mg Oral Daily  . enoxaparin (LOVENOX) injection  40 mg Subcutaneous Q24H  . famotidine  20 mg Oral Q12H  . [START ON 10/20/2017] furosemide  40 mg Oral Daily  . Gerhardt's butt cream   Topical TID  . guaiFENesin  1,200 mg Oral BID  . insulin aspart  0-15 Units Subcutaneous TID  WC  . insulin aspart  0-5 Units Subcutaneous QHS  . ipratropium-albuterol  3 mL Nebulization TID  . magnesium oxide  400 mg Oral Q12H  . mouth rinse  15 mL Mouth Rinse q12n4p  . metoprolol succinate  50 mg Oral Daily  . mirtazapine  30 mg Oral QHS  . pantoprazole  40 mg Oral Q12H    Continuous Infusions: . cefTRIAXone (ROCEPHIN)  IV Stopped (10/19/17 1101)    PRN Meds: acetaminophen **OR** acetaminophen, clonazePAM, guaiFENesin-dextromethorphan, ipratropium-albuterol, metoprolol tartrate, morphine, ondansetron **OR** ondansetron (ZOFRAN) IV, senna-docusate, sodium chloride  Physical Exam      General: Alert, awake, in bed with [place] in place.  HEENT: No bruits, no goiter, no JVD Heart: Irregular rhythm. No murmur appreciated. Lungs: Coarse in bases, diminished air movement Abdomen: Soft, nontender, nondistended, positive bowel sounds.  Ext: 1+ edema Skin: Warm and dry Neuro: Grossly intact, nonfocal.     Vital Signs: BP 98/67   Pulse (!) 58   Temp (!) 97.3 F (36.3 C) (Axillary)   Resp 18   Ht _0  (1.499 m)   Wt 87.2 kg (192 lb 3.9 oz)   SpO2 97%   BMI 38.83 kg/m  SpO2: SpO2: 97 % O2 Device: O2 Device: Bi-PAP O2 Flow Rate: O2 Flow Rate (L/min): 10 L/min  Intake/output summary:   Intake/Output Summary (Last 24 hours) at 10/19/2017 2009 Last data filed at 10/19/2017 1800 Gross per 24 hour  Intake 245 ml  Output 800 ml  Net -555 ml   LBM: Last BM Date:  10/18/17 Baseline Weight: Weight: 77.1 kg (170 lb) Most recent weight: Weight: 87.2 kg (192 lb 3.9 oz)       Palliative Assessment/Data:      Patient Active Problem List   Diagnosis Date Noted  . SOB (shortness of breath)   . AF (paroxysmal atrial fibrillation) ([place]) 10/13/2017  . Pneumococcal bacteremia 10/11/2017  . HCAP (healthcare-associated pneumonia) 10/11/2017  . Acute on chronic diastolic CHF (congestive heart failure) ([place]) 10/11/2017  . COPD (chronic obstructive pulmonary disease) ([place]) 10/11/2017  . HTN (hypertension) 10/11/2017  . AKI (acute kidney injury) ([place]) 10/11/2017  . DM (diabetes mellitus) ([place]) 10/11/2017  . Lactic acidosis 10/11/2017  . Elevated troponin 10/11/2017  . Hyperkalemia 10/11/2017  . Acute on chronic respiratory failure ([place]) 10/10/2017    Palliative Care Assessment & Plan   Patient Profile: 62 y.o. female  with past medical history of COPD oxygen been on 6 L, hypertension, CHF, diabetes type 2, SVT with MAT admitted on 10/10/2017 with shortness of breath and COPD exacerbation.  Palliative consulted for goals of care.  Assessment: Patient Active Problem List   Diagnosis Date Noted  . SOB (shortness of breath)   . AF (paroxysmal atrial fibrillation) ([place]) 10/13/2017  . Pneumococcal bacteremia 10/11/2017  . HCAP (healthcare-associated pneumonia) 10/11/2017  . Acute on chronic diastolic CHF (congestive heart failure) ([place]) 10/11/2017  . COPD (chronic obstructive pulmonary disease) ([place]) 10/11/2017  . HTN (hypertension) 10/11/2017  . AKI (acute kidney injury) ([place]) 10/11/2017  . DM (diabetes mellitus) ([place]) 10/11/2017  . Lactic acidosis 10/11/2017  . Elevated troponin 10/11/2017  . Hyperkalemia 10/11/2017  . Acute on chronic respiratory failure ([place]) 10/10/2017    Recommendations/Plan: - For anxiety: Continue Remeron to 32m QHS.  Continue Klonopin 0.243mQ12hours prn.  - For dyspnea: Morphine 2.71m71m 71mg83mery4 hours prn.  Bipap as  needed for comfort. - Plan for home with hospice.  Preference for [facility].  Will need to continue  Bipap/trilogy on discharge.  Prognosis:   < 6 months  Discharge Planning:  Home with Hospice  Care plan was discussed with patient, RN, Dr. Posey Pronto, Dr. Lake Bells  Thank you for allowing the Palliative Medicine Team to assist in the care of this patient.   Total Time 40 Prolonged Time Billed No      Greater than 50%  of this time was spent counseling and coordinating care related to the above assessment and plan.  Micheline Rough, MD  Please contact Palliative Medicine Team phone at [phone] for questions and concerns.

## 2017-10-19 NOTE — Progress Notes (Signed)
Tidal volumes low with increased spontaneous rate and oxygen desaturations. BiPAP settings adjusted and documented on the flowsheet. SpO2 and volumes increased to acceptable limits. RN aware.

## 2017-10-20 LAB — BASIC METABOLIC PANEL
Anion gap: 11 (ref 5–15)
BUN: 26 mg/dL — ABNORMAL HIGH (ref 8–23)
CALCIUM: 8.7 mg/dL — AB (ref 8.9–10.3)
CO2: 40 mmol/L — ABNORMAL HIGH (ref 22–32)
CREATININE: 0.72 mg/dL (ref 0.44–1.00)
Chloride: 99 mmol/L (ref 98–111)
GFR calc Af Amer: >60 mL/min (ref >60)
Glucose, Bld: 80 mg/dL (ref 70–99)
Potassium: 4.1 mmol/L (ref 3.5–5.1)
SODIUM: 150 mmol/L — AB (ref 135–145)

## 2017-10-20 LAB — GLUCOSE, CAPILLARY
GLUCOSE-CAPILLARY: 58 mg/dL — AB (ref 70–99)
GLUCOSE-CAPILLARY: 118 mg/dL — AB (ref 70–99)
GLUCOSE-CAPILLARY: 131 mg/dL — AB (ref 70–99)
Glucose-Capillary: 127 mg/dL — ABNORMAL HIGH (ref 70–99)
Glucose-Capillary: 162 mg/dL — ABNORMAL HIGH (ref 70–99)

## 2017-10-20 LAB — CBC
HCT: 39.4 % (ref 36.0–46.0)
Hemoglobin: 11.2 g/dL — ABNORMAL LOW (ref 12.0–15.0)
MCH: 28.4 pg (ref 26.0–34.0)
MCHC: 28.4 g/dL — AB (ref 30.0–36.0)
MCV: 99.7 fL (ref 78.0–100.0)
Platelets: 221 10*3/uL (ref 150–400)
RBC: 3.95 MIL/uL (ref 3.87–5.11)
RDW: 17.4 % — AB (ref 11.5–15.5)
WBC: 9.3 10*3/uL (ref 4.0–10.5)

## 2017-10-20 LAB — BASIC METABOLIC PANEL WITH GFR
Anion gap: 10 (ref 5–15)
BUN: 21 mg/dL (ref 8–23)
CO2: 41 mmol/L — ABNORMAL HIGH (ref 22–32)
Calcium: 8.5 mg/dL — ABNORMAL LOW (ref 8.9–10.3)
Chloride: 100 mmol/L (ref 98–111)
Creatinine, Ser: 0.73 mg/dL (ref 0.44–1.00)
GFR calc Af Amer: >60 mL/min
GFR calc non Af Amer: >60 mL/min
Glucose, Bld: 135 mg/dL — ABNORMAL HIGH (ref 70–99)
Potassium: 4.4 mmol/L (ref 3.5–5.1)
Sodium: 151 mmol/L — ABNORMAL HIGH (ref 135–145)

## 2017-10-20 MED ORDER — DEXTROSE 50 % IV SOLN
INTRAVENOUS | Status: AC
  Administered 2017-10-20: 50 mL
  Filled 2017-10-20: qty 50

## 2017-10-20 MED ORDER — KETOROLAC TROMETHAMINE 30 MG/ML IJ SOLN
30.0000 mg | Freq: Once | INTRAMUSCULAR | Status: AC
  Administered 2017-10-20: 30 mg via INTRAVENOUS
  Filled 2017-10-20: qty 1

## 2017-10-20 MED ORDER — DILTIAZEM HCL 30 MG PO TABS
30.0000 mg | ORAL_TABLET | Freq: Four times a day (QID) | ORAL | Status: DC | PRN
  Administered 2017-10-21 – 2017-10-22 (×2): 30 mg via ORAL
  Filled 2017-10-20 (×2): qty 1

## 2017-10-20 MED ORDER — SODIUM CHLORIDE 0.9 % IV BOLUS
500.0000 mL | Freq: Once | INTRAVENOUS | Status: AC
  Administered 2017-10-20: 500 mL via INTRAVENOUS

## 2017-10-20 MED ORDER — DEXTROSE 10 % IV SOLN
INTRAVENOUS | Status: DC
  Administered 2017-10-20 – 2017-10-21 (×2): via INTRAVENOUS

## 2017-10-20 MED ORDER — DILTIAZEM HCL ER COATED BEADS 120 MG PO CP24: 120.0000 mg | ORAL_CAPSULE | Freq: Every day | ORAL | Status: DC

## 2017-10-20 MED ORDER — MORPHINE SULFATE (PF) 2 MG/ML IV SOLN
1.0000 mg | Freq: Once | INTRAVENOUS | Status: AC
  Administered 2017-10-20: 1 mg via INTRAVENOUS
  Filled 2017-10-20: qty 1

## 2017-10-20 NOTE — Progress Notes (Signed)
Hypoglycemic Event  CBG: 52   Treatment: D50 IV 50 mL  Symptoms: Sweaty, Shaky and Nervous/irritable  Follow-up CBG: [id number] CBG Result:118  Possible Reasons for Event: Inadequate meal intake  Comments/MD notified:MD Patel at bedside and aware. Will continue to monitor.    [place], Tobie Lords

## 2017-10-20 NOTE — Progress Notes (Signed)
[facility] Progress Note  Patient: Vanessa Alexander ZDG:[id number]   PCP: Townsend Roger, MD DOB: 06-Feb-1956   DOA: 10/10/2017   DOS: 10/20/2017   Date of Service: the patient was seen and examined on 10/20/2017  Subjective: Patient is more lethargic, complains about bilateral knee pain.  No nausea no vomiting.  Blood pressures running in 80s this morning.  Required IV morphine last night for pain control.  Poor p.o. intake and remains on BiPAP for the most part of the day.  Brief hospital course: Pt. with PMH of COPD, chronic respiratory failure on 6 L of oxygen, HTN, CHF, type II DM, SVT; admitted on 10/10/2017, presented with complaint of shortness of breath, was found to have pneumonia with acute hypoxic respiratory failure and Streptococcus bacteremia with COPD exacerbation. Currently further plan is  provide comfort and BiPAP as needed and arrange for outpatient home hospice with BiPAP  Assessment and Plan: 1.  Acute on chronic hypoxic respiratory failure. Acute COPD exacerbation. Acute on chronic diastolic CHF. Streptococcus bacteremia. Bilateral pneumonia secondary to Streptococcus pneumoniae. Presented with shortness of breath, requiring BiPAP. Uses BiPAP at night but using BiPAP throughout the day here. Stable on nonrebreather but desats on high flow nasal cannula. Started with IV vancomycin and IV cefepime, blood cultures were positive for Streptococcus pneumoniae.  Infectious disease was consulted. Currently on IV ceftriaxone only, ID recommends to complete 10-day treatment course, PCCM recommends 14-day treatment course.  We will continue while patient is in the hospital.  Last day 10/23/2017. Patient was also given IV Solu-Medrol, now titrating down to oral prednisone. Given high-dose IV Lasix initially with worsening renal function transition back to oral Lasix. Back on IV lasix. Switch to oral. Now on hold. Palliative care consulted for goals of care discussion since  patient has large component of anxiety contributing to her respiratory distress. Echocardiogram April 2019 shows preserved EF, diastolic dysfunction, severe right-sided dysfunction as well as pulmonary hypertension. Repeat Echocardiogram shows preserved EF Continue Mucinex, duo nebs, pulmonary toilet, Pulmicort and Brovana.  2.  Acute kidney injury. Hyperkalemia Hypernatremia Multifactorial, initially cardiorenal later on with diuresis. Now renal function back to normal. Monitor. Potassium corrected with Kayexalate. Continue IV D10  3.  Elevated troponin. HTN. SVT Due to demand ischemia from acute hypoxia as well as CHF exacerbation. Hold Cardizem, Toprol-XL and Lasix. Cardizem dose increased from 180-240 tolerating it very well. Digoxin level therapeutic. Heart rate currently appears to have well controlled. PRN IV Lopressor for SVTs.  4.  Type 2 diabetes mellitus, uncontrolled.  No complication.  Long-term steroid use. Prior hemoglobin A1c 7.2. Currently uncontrolled in the hospital with hyper and hypoglycemia. Patient blood sugar was significantly elevated due to steroids requiring Levemir 20 mg twice a day as well as a resistant sliding scale as well as scheduled pre-meal coverage. Now that the patient is on 20 mg prednisone her insulin requirement has gone down and the patient had an episode of hypoglycemia on 10/17/2017 and on 10/19/2017. Discontinue scheduled Levemir and just continue sliding scale for now.  5.  Anxiety Mood disorder. Remeron increased to 30 mg nightly. Benzodiazepine switch to Klonopin.  6.  Respiratory distress. Palliative care consulted. Morphine dose adjusted although the patient still is not using this medication. Prefer to use this medication if the patient is stable enough to use it during the daytime for respiratory distress as opposed to going back on BiPAP.  7.  Goals of care discussion. Palliative care consulted, appreciate their  assistance. Family meeting  return on 10/19/2017. Patient herself expressed her wishes that she does not want to be resuscitated or intubated. She also requested that she thinks hospice would be a better option if they would take BiPAP. CCM, palliative care as well as myself were present during the meeting.  Patient had contacted hospice of [place] in the past, will discuss with them regarding arranging home hospice with BiPAP. Patient is hypotensive and hypoglycemic this morning.  Started on D10 as well as given IV fluid bolus. Sodium is 151. Discussed with patient's daughter as well as palliative care.  Currently on working to our goal of going home with hospice we will treat blood pressure and sugar to see if the patient responds appropriately to the therapy.  If not I have informed the daughter that we may have to consider option of inpatient hospice.  8. Bilateral leg weakness RN called to notify bilateral leg weakness that she noted on shift change I evaluated the pt at bedside, pt has atleast 4/5 bilateral leg strength without any other focal deficit. This appear unchanged from my prior eval.  A STAT CT HEAD also negative for any acute abnormality  Likely due to pt receiving morphine and Klonopin causing her to have generalized weakness.  Discontinued buspar.  9. Sacral ulcers Pt has multiple small punched out ulcers on her sacrum.  None of them appear infected.  Appears to present on admission  Mildly elevated ESR CRP Wound care consult appreciated,  Continue foam dressing and Gerhardt's butt cream.  Diet: regular diet DVT Prophylaxis: subcutaneous Heparin  Advance goals of care discussion: full code  Family Communication: family was present at bedside, at the time of interview.  Patient allowed permission discussed with the family.  All questions answered.  Disposition:  Discharge to be determined.  Consultants: Palliative care  PCCM   Procedures:  bipap  Antibiotics: Anti-infectives (From admission, onward)   Start     Dose/Rate Route Frequency Ordered Stop   10/12/17 0000  vancomycin (VANCOCIN) IVPB 750 mg/150 ml premix  Status:  Discontinued     750 mg 150 mL/hr over 60 Minutes Intravenous Every 36 hours 10/10/17 1532 10/11/17 1010   10/11/17 1000  cefTRIAXone (ROCEPHIN) 2 g in sodium chloride 0.9 % 100 mL IVPB     2 g 200 mL/hr over 30 Minutes Intravenous Every 24 hours 10/11/17 0810     10/10/17 2200  ceFEPIme (MAXIPIME) 2 g in sodium chloride 0.9 % 100 mL IVPB  Status:  Discontinued     2 g 200 mL/hr over 30 Minutes Intravenous Every 12 hours 10/10/17 1532 10/11/17 0810   10/10/17 1230  ceFEPIme (MAXIPIME) 2 g in sodium chloride 0.9 % 100 mL IVPB     2 g 200 mL/hr over 30 Minutes Intravenous  Once 10/10/17 1225 10/10/17 1312   10/10/17 1230  vancomycin (VANCOCIN) 1,500 mg in sodium chloride 0.9 % 500 mL IVPB     1,500 mg 250 mL/hr over 120 Minutes Intravenous  Once 10/10/17 1225 10/10/17 1604       Objective: Physical Exam: Vitals:   10/20/17 1129 10/20/17 1202 10/20/17 1317 10/20/17 1417  BP: (!) 91/54 (!) 88/56 (!) 81/59   Pulse: 68 64 63   Resp: _0 Temp:      TempSrc:      SpO2: 93% 98% 97% 97%  Weight:      Height:        Intake/Output Summary (Last 24 hours) at 10/20/2017 1504  Last data filed at 10/20/2017 1121 Gross per 24 hour  Intake 131.67 ml  Output 600 ml  Net -468.33 ml   Filed Weights   10/17/17 0500 10/19/17 0600 10/20/17 0500  Weight: 85.7 kg (188 lb 15 oz) 87.2 kg (192 lb 3.9 oz) 82.6 kg (182 lb 1.6 oz)   General: Alert, Awake and Oriented to Time, Place and Person. Appear in no distress, affect appropriate Eyes: PERRL, Conjunctiva normal ENT: Oral Mucosa clear moist. Neck: Positive JVD, no Abnormal Mass Or lumps Cardiovascular: S1 and S2 Present, aortic systolic Murmur, Peripheral Pulses Present Respiratory: increased respiratory effort, Bilateral Air entry equal and Decreased,  positive use of accessory muscle, bilateral Crackles, expiratory  wheezes Abdomen: Bowel Sound present, Soft and no tenderness, no hernia Skin: no redness, no Rash, no induration, sacral ulcers. Extremities: no Pedal edema, no calf tenderness Neurologic: Grossly no focal neuro deficit. Bilaterally Equal motor strength  Data Reviewed: CBC: Recent Labs  Lab 10/15/17 0300 10/16/17 0323 10/17/17 0320 10/19/17 0352 10/20/17 0317  WBC 10.3 10.0 10.3 10.0 9.3  HGB 10.5* 10.5* 10.7* 11.3* 11.2*  HCT 36.5 36.5 36.9 39.3 39.4  MCV 97.9 98.1 97.4 99.2 99.7  PLT 175 196 229 249 093   Basic Metabolic Panel: Recent Labs  Lab 10/14/17 0330  10/17/17 0320 10/17/17 1502 10/18/17 0843 10/19/17 0352 10/20/17 0317 10/20/17 1350  NA 140   < > 148* 147* 148* 147* 150* 151*  K 4.5   < > 3.6 4.0 4.4 4.2 4.1 4.4  CL 96*   < > 103 97* 100 99 99 100  CO2 35*   < > 36* 37* 37* 37* 40* 41*  GLUCOSE 347*   < > 59* 214* 148* 73 80 135*  BUN 51*   < > 42* 36* 34* 34* 26* 21  CREATININE 1.05*   < > 0.76 0.90 0.73 0.80 0.72 0.73  CALCIUM 8.6*   < > 8.7* 8.5* 8.7* 8.6* 8.7* 8.5*  MG 2.5*  --  2.3 2.0 2.0 2.2  --   --   PHOS 3.1  --   --   --   --   --   --   --    < > = values in this interval not displayed.    Liver Function Tests: No results for input(s): AST, ALT, ALKPHOS, BILITOT, PROT, ALBUMIN in the last 168 hours. No results for input(s): LIPASE, AMYLASE in the last 168 hours. No results for input(s): AMMONIA in the last 168 hours. Coagulation Profile: No results for input(s): INR, PROTIME in the last 168 hours. Cardiac Enzymes: Recent Labs  Lab 10/17/17 1502  CKTOTAL 30*   BNP (last 3 results) No results for input(s): PROBNP in the last 8760 hours. CBG: Recent Labs  Lab 10/19/17 1618 10/19/17 2121 10/20/17 0741 10/20/17 0840 10/20/17 1211  GLUCAP 159* 88 58* 118* 127*   Studies: No results found.  Scheduled Meds: . arformoterol  15 mcg Nebulization BID  . atorvastatin  40  mg Oral Daily  . budesonide (PULMICORT) nebulizer solution  0.5 mg Nebulization BID  . chlorhexidine  15 mL Mouth Rinse BID  . digoxin  0.125 mg Oral Daily  . enoxaparin (LOVENOX) injection  40 mg Subcutaneous Q24H  . famotidine  20 mg Oral Q12H  . Gerhardt's butt cream   Topical TID  . guaiFENesin  1,200 mg Oral BID  . insulin aspart  0-15 Units Subcutaneous TID WC  . insulin aspart  0-5 Units Subcutaneous QHS  .  ipratropium-albuterol  3 mL Nebulization TID  . mouth rinse  15 mL Mouth Rinse q12n4p  . mirtazapine  30 mg Oral QHS   Continuous Infusions: . cefTRIAXone (ROCEPHIN)  IV Stopped (10/20/17 1152)  . dextrose 50 mL/hr at 10/20/17 1224   PRN Meds: acetaminophen **OR** acetaminophen, clonazePAM, diltiazem, guaiFENesin-dextromethorphan, ipratropium-albuterol, metoprolol tartrate, morphine, ondansetron **OR** ondansetron (ZOFRAN) IV, senna-docusate, sodium chloride  Time spent: 35 minutes  Author: Berle Mull, MD [facility] Pager: [phone] 10/20/2017 3:04 PM  If 7PM-7AM, please contact night-coverage at [email or web address], password [facility]

## 2017-10-20 NOTE — Progress Notes (Signed)
Removed pt from bipap and placed on 10-15 L [place] flow. Pt was immediately short of breath with o2 saturations in the low 80s. Placed back on bipap.

## 2017-10-20 NOTE — Progress Notes (Signed)
PT Cancellation Note  Patient Details Name: Vanessa Alexander MRN: [id number] DOB: Mar 02, 1956   Cancelled Treatment:    Reason Eval/Treat Not Completed: Medical issues which prohibited therapy(per RN pt has had medical decline, will likely DC to inpt hospice. RN recommended PT sign off. )   Philomena Doheny 10/20/2017, 1:40 PM [phone]

## 2017-10-20 NOTE — Progress Notes (Signed)
MD Posey Pronto paged to bedside about patient's lethargy and hypotension. Patient is able to open her eyes and follow simple commands. New orders received, will continue to closely monitor.

## 2017-10-20 NOTE — Progress Notes (Signed)
Daily Progress Note   Patient Name: Vanessa Alexander       Date: 10/20/2017 DOB: [birth year]  Age: 62 y.o. MRN#: [id number] Attending Physician: Lavina Hamman, MD Primary Care Physician: Nona Dell, Corene Cornea, MD Admit Date: 10/10/2017  Reason for Consultation/Follow-up: Establishing goals of care and Non pain symptom management  Subjective: Sitting in bed wearing Bipap on entering room.    Reports being tired today.  States feeling SOB and "not ready" to try off Bipap.  Length of Stay: 10  Current Medications: Scheduled Meds:  . arformoterol  15 mcg Nebulization BID  . atorvastatin  40 mg Oral Daily  . budesonide (PULMICORT) nebulizer solution  0.5 mg Nebulization BID  . chlorhexidine  15 mL Mouth Rinse BID  . digoxin  0.125 mg Oral Daily  . enoxaparin (LOVENOX) injection  40 mg Subcutaneous Q24H  . famotidine  20 mg Oral Q12H  . Gerhardt's butt cream   Topical TID  . guaiFENesin  1,200 mg Oral BID  . insulin aspart  0-15 Units Subcutaneous TID WC  . insulin aspart  0-5 Units Subcutaneous QHS  . ipratropium-albuterol  3 mL Nebulization TID  . magnesium oxide  400 mg Oral Q12H  . mouth rinse  15 mL Mouth Rinse q12n4p  . metoprolol succinate  50 mg Oral Daily  . mirtazapine  30 mg Oral QHS  . pantoprazole  40 mg Oral Q12H    Continuous Infusions: . cefTRIAXone (ROCEPHIN)  IV Stopped (10/19/17 1101)  . dextrose 50 mL/hr at 10/20/17 1188    PRN Meds: acetaminophen **OR** acetaminophen, clonazePAM, diltiazem, guaiFENesin-dextromethorphan, ipratropium-albuterol, metoprolol tartrate, morphine, ondansetron **OR** ondansetron (ZOFRAN) IV, senna-docusate, sodium chloride  Physical Exam      General: Alert, awake, in bed with Bipap in place.  HEENT: No bruits, no goiter, no JVD Heart:  Irregular rhythm. No murmur appreciated. Lungs: Coarse in bases, diminished air movement Abdomen: Soft, nontender, nondistended, positive bowel sounds.  Ext: 1+ edema Skin: Warm and dry Neuro: Grossly intact, nonfocal.     Vital Signs: BP 111/78   Pulse 76   Temp 97.6 F (36.4 C) (Axillary)   Resp 12   Ht _0  (1.499 m)   Wt 82.6 kg (182 lb 1.6 oz)   SpO2 95%   BMI 36.78 kg/m  SpO2: SpO2: 95 % O2  Device: O2 Device: Bi-PAP O2 Flow Rate: O2 Flow Rate (L/min): 10 L/min  Intake/output summary:   Intake/Output Summary (Last 24 hours) at 10/20/2017 1050 Last data filed at 10/20/2017 0900 Gross per 24 hour  Intake 156.67 ml  Output 600 ml  Net -443.33 ml   LBM: Last BM Date: 10/19/17 Baseline Weight: Weight: 77.1 kg (170 lb) Most recent weight: Weight: 82.6 kg (182 lb 1.6 oz)       Palliative Assessment/Data:      Patient Active Problem List   Diagnosis Date Noted  . Goals of care, counseling/discussion   . Palliative care encounter   . SOB (shortness of breath)   . AF (paroxysmal atrial fibrillation) ([place]) 10/13/2017  . Pneumococcal bacteremia 10/11/2017  . HCAP (healthcare-associated pneumonia) 10/11/2017  . Acute on chronic diastolic CHF (congestive heart failure) ([place]) 10/11/2017  . COPD (chronic obstructive pulmonary disease) ([place]) 10/11/2017  . HTN (hypertension) 10/11/2017  . AKI (acute kidney injury) ([place]) 10/11/2017  . DM (diabetes mellitus) ([place]) 10/11/2017  . Lactic acidosis 10/11/2017  . Elevated troponin 10/11/2017  . Hyperkalemia 10/11/2017  . Acute on chronic respiratory failure ([place]) 10/10/2017    Palliative Care Assessment & Plan   Patient Profile: 62 y.o. female  with past medical history of COPD oxygen been on 6 L, hypertension, CHF, diabetes type 2, SVT with MAT admitted on 10/10/2017 with shortness of breath and COPD exacerbation.  Palliative consulted for goals of care.  Assessment: Patient Active Problem List   Diagnosis Date Noted  .  Goals of care, counseling/discussion   . Palliative care encounter   . SOB (shortness of breath)   . AF (paroxysmal atrial fibrillation) ([place]) 10/13/2017  . Pneumococcal bacteremia 10/11/2017  . HCAP (healthcare-associated pneumonia) 10/11/2017  . Acute on chronic diastolic CHF (congestive heart failure) ([place]) 10/11/2017  . COPD (chronic obstructive pulmonary disease) ([place]) 10/11/2017  . HTN (hypertension) 10/11/2017  . AKI (acute kidney injury) ([place]) 10/11/2017  . DM (diabetes mellitus) ([place]) 10/11/2017  . Lactic acidosis 10/11/2017  . Elevated troponin 10/11/2017  . Hyperkalemia 10/11/2017  . Acute on chronic respiratory failure ([place]) 10/10/2017    Recommendations/Plan: - For anxiety: Continue Remeron to 4m QHS.  Continue Klonopin 0.2265mQ12hours prn.  - For dyspnea: Morphine 2.65m69m 65mg265mery 4 hours prn.  Bipap as needed for comfort. - Plan for home with hospice.  Preference for [facility].  Will need to continue Bipap/trilogy on discharge.  CM notes daughter to meet with [facility].  Prognosis:   < 6 months  Discharge Planning:  Home with Hospice  Care plan was discussed with patient, RN  Thank you for allowing the Palliative Medicine Team to assist in the care of this patient.   Total Time 20 Prolonged Time Billed No      Greater than 50%  of this time was spent counseling and coordinating care related to the above assessment and plan.  GeneMicheline Rough  Please contact Palliative Medicine Team phone at [phone] questions and concerns.

## 2017-10-21 DIAGNOSIS — J430 Unilateral pulmonary emphysema [MacLeod's syndrome]: Secondary | ICD-10-CM

## 2017-10-21 LAB — BASIC METABOLIC PANEL
ANION GAP: 10 (ref 5–15)
BUN: 21 mg/dL (ref 8–23)
CALCIUM: 8.4 mg/dL — AB (ref 8.9–10.3)
CO2: 38 mmol/L — ABNORMAL HIGH (ref 22–32)
CREATININE: 0.69 mg/dL (ref 0.44–1.00)
Chloride: 97 mmol/L — ABNORMAL LOW (ref 98–111)
GFR calc non Af Amer: >60 mL/min (ref >60)
Glucose, Bld: 311 mg/dL — ABNORMAL HIGH (ref 70–99)
Potassium: 4.2 mmol/L (ref 3.5–5.1)
SODIUM: 145 mmol/L (ref 135–145)

## 2017-10-21 LAB — GLUCOSE, CAPILLARY
GLUCOSE-CAPILLARY: 286 mg/dL — AB (ref 70–99)
GLUCOSE-CAPILLARY: 233 mg/dL — AB (ref 70–99)
GLUCOSE-CAPILLARY: 263 mg/dL — AB (ref 70–99)
Glucose-Capillary: 100 mg/dL — ABNORMAL HIGH (ref 70–99)
Glucose-Capillary: 209 mg/dL — ABNORMAL HIGH (ref 70–99)

## 2017-10-21 NOTE — Consult Note (Signed)
Vanessa Alexander: Met with Pt/Family at bedside on 10-20-17 to discuss hospice services and review goals of care. Pt is lethargic but does answer questions confirming her wish to go home with hospice and not return to hospital. DNR is her wish. Due to status she may be appropriate for inpatient while goal remains to go home. Daughter tells me she is hoping for discharge on Tuesday to allow her time to prepare. We are prepared to order equipment and evaluate today on 10-21-17 to assess status and most appropriate discharge plan based on her condition. Thank you for the opportunity to serve this family. Doroteo Glassman, RN [facility]

## 2017-10-21 NOTE — Progress Notes (Signed)
[facility] Progress Note  Patient: Vanessa Alexander TML:[id number]   PCP: Townsend Roger, MD DOB: April 02, 1956   DOA: 10/10/2017   DOS: 10/21/2017   Date of Service: the patient was seen and examined on 10/21/2017  Subjective: No acute complaint no nausea no vomiting no fever no chills.  Remains tired and lethargic.  Brief hospital course: Pt. with PMH of COPD, chronic respiratory failure on 6 L of oxygen, HTN, CHF, type II DM, SVT; admitted on 10/10/2017, presented with complaint of shortness of breath, was found to have pneumonia with acute hypoxic respiratory failure and Streptococcus bacteremia with COPD exacerbation. Currently further plan is  provide comfort and BiPAP as needed and arrange for outpatient home hospice with BiPAP  Assessment and Plan: 1.  Acute on chronic hypoxic respiratory failure. Acute COPD exacerbation. Acute on chronic diastolic CHF. Streptococcus bacteremia. Bilateral pneumonia secondary to Streptococcus pneumoniae. Presented with shortness of breath, requiring BiPAP. Uses BiPAP at night but using BiPAP throughout the day here. Stable on nonrebreather but desats on high flow nasal cannula. Started with IV vancomycin and IV cefepime, blood cultures were positive for Streptococcus pneumoniae.  Infectious disease was consulted. Currently on IV ceftriaxone only, ID recommends to complete 10-day treatment course, PCCM recommends 14-day treatment course.  We will continue while patient is in the hospital.  Last day 10/23/2017. Patient was also given IV Solu-Medrol, now titrating down to oral prednisone. Given high-dose IV Lasix initially with worsening renal function transition back to oral Lasix. Back on IV lasix. Switch to oral. Now on hold. Palliative care consulted for goals of care discussion since patient has large component of anxiety contributing to her respiratory distress. Echocardiogram April 2019 shows preserved EF, diastolic dysfunction, severe right-sided  dysfunction as well as pulmonary hypertension. Repeat Echocardiogram shows preserved EF Continue Mucinex, duo nebs, pulmonary toilet, Pulmicort and Brovana.  2.  Acute kidney injury. Hyperkalemia Hypernatremia Multifactorial, initially cardiorenal later on with diuresis. Now renal function back to normal. Monitor. Potassium corrected with Kayexalate. D10 on hold.  3.  Elevated troponin. HTN. SVT Due to demand ischemia from acute hypoxia as well as CHF exacerbation. Hold Cardizem, Toprol-XL and Lasix. Cardizem dose increased from 180-240 tolerating it very well. Digoxin level therapeutic. Heart rate currently appears to have well controlled. PRN IV Lopressor for SVTs.  4.  Type 2 diabetes mellitus, uncontrolled.  No complication.  Long-term steroid use. Prior hemoglobin A1c 7.2. Currently uncontrolled in the hospital with hyper and hypoglycemia. Patient blood sugar was significantly elevated due to steroids requiring Levemir 20 mg twice a day as well as a resistant sliding scale as well as scheduled pre-meal coverage. Now that the patient is on 20 mg prednisone her insulin requirement has gone down and the patient had an episode of hypoglycemia on 10/17/2017 and on 10/19/2017. Discontinue scheduled Levemir and just continue sliding scale for now.  5.  Anxiety Mood disorder. Remeron increased to 30 mg nightly. Benzodiazepine switch to Klonopin.  6.  Respiratory distress. Palliative care consulted. Morphine dose adjusted although the patient still is not using this medication. Prefer to use this medication if the patient is stable enough to use it during the daytime for respiratory distress as opposed to going back on BiPAP.  7.  Goals of care discussion. Palliative care consulted, appreciate their assistance. Family meeting return on 10/19/2017. Patient herself expressed her wishes that she does not want to be resuscitated or intubated. She also requested that she thinks  hospice would be a better option  if they would take BiPAP. CCM, palliative care as well as myself were present during the meeting.  Patient had contacted [facility] in the past, will discuss with them regarding arranging home hospice with BiPAP. Patient is hypotensive and hypoglycemic this morning.  Started on D10 as well as given IV fluid bolus. Sodium is 151. Discussed with patient's daughter as well as palliative care.  Currently on working to our goal of going home with hospice equipment will be delivered tomorrow.  Patient will be discharged home tomorrow.  If not I have informed the daughter that we may have to consider option of inpatient hospice.  8. Bilateral leg weakness RN called to notify bilateral leg weakness that she noted on shift change I evaluated the pt at bedside, pt has atleast 4/5 bilateral leg strength without any other focal deficit. This appear unchanged from my prior eval.  A STAT CT HEAD also negative for any acute abnormality  Likely due to pt receiving morphine and Klonopin causing her to have generalized weakness.  Discontinued buspar.  9. Sacral ulcers Pt has multiple small punched out ulcers on her sacrum.  None of them appear infected.  Appears to present on admission  Mildly elevated ESR CRP Wound care consult appreciated,  Continue foam dressing and Gerhardt's butt cream.  Diet: regular diet DVT Prophylaxis: subcutaneous Heparin  Advance goals of care discussion: full code  Family Communication: family was present at bedside, at the time of interview.  Patient allowed permission discussed with the family.  All questions answered.  Disposition:  Discharge to be determined.  Consultants: Palliative care  PCCM   Procedures: bipap  Antibiotics: Anti-infectives (From admission, onward)   Start     Dose/Rate Route Frequency Ordered Stop   10/12/17 0000  vancomycin (VANCOCIN) IVPB 750 mg/150 ml premix  Status:  Discontinued     750  mg 150 mL/hr over 60 Minutes Intravenous Every 36 hours 10/10/17 1532 10/11/17 1010   10/11/17 1000  cefTRIAXone (ROCEPHIN) 2 g in sodium chloride 0.9 % 100 mL IVPB     2 g 200 mL/hr over 30 Minutes Intravenous Every 24 hours 10/11/17 0810     10/10/17 2200  ceFEPIme (MAXIPIME) 2 g in sodium chloride 0.9 % 100 mL IVPB  Status:  Discontinued     2 g 200 mL/hr over 30 Minutes Intravenous Every 12 hours 10/10/17 1532 10/11/17 0810   10/10/17 1230  ceFEPIme (MAXIPIME) 2 g in sodium chloride 0.9 % 100 mL IVPB     2 g 200 mL/hr over 30 Minutes Intravenous  Once 10/10/17 1225 10/10/17 1312   10/10/17 1230  vancomycin (VANCOCIN) 1,500 mg in sodium chloride 0.9 % 500 mL IVPB     1,500 mg 250 mL/hr over 120 Minutes Intravenous  Once 10/10/17 1225 10/10/17 1604       Objective: Physical Exam: Vitals:   10/21/17 1400 10/21/17 1500 10/21/17 1518 10/21/17 1600  BP: 115/61 104/75  113/78  Pulse: 75 81  91  Resp: _0 Temp:    (!) 97.5 F (36.4 C)  TempSrc:    Axillary  SpO2: 94% 91% 97% 90%  Weight:      Height:        Intake/Output Summary (Last 24 hours) at 10/21/2017 1731 Last data filed at 10/21/2017 1500 Gross per 24 hour  Intake 1015.2 ml  Output 328 ml  Net 687.2 ml   Filed Weights   10/19/17 0600 10/20/17 0500 10/21/17 0500  Weight: 87.2 kg (192 lb 3.9 oz) 82.6 kg (182 lb 1.6 oz) 85.6 kg (188 lb 11.4 oz)   General: Alert, Awake and Oriented to Time, Place and Person. Appear in no distress, affect appropriate Eyes: PERRL, Conjunctiva normal ENT: Oral Mucosa clear moist. Neck: Positive JVD, no Abnormal Mass Or lumps Cardiovascular: S1 and S2 Present, aortic systolic Murmur, Peripheral Pulses Present Respiratory: increased respiratory effort, Bilateral Air entry equal and Decreased, positive use of accessory muscle, bilateral Crackles, expiratory  wheezes Abdomen: Bowel Sound present, Soft and no tenderness, no hernia Skin: no redness, no Rash, no induration, sacral  ulcers. Extremities: no Pedal edema, no calf tenderness Neurologic: Grossly no focal neuro deficit. Bilaterally Equal motor strength  Data Reviewed: CBC: Recent Labs  Lab 10/15/17 0300 10/16/17 0323 10/17/17 0320 10/19/17 0352 10/20/17 0317  WBC 10.3 10.0 10.3 10.0 9.3  HGB 10.5* 10.5* 10.7* 11.3* 11.2*  HCT 36.5 36.5 36.9 39.3 39.4  MCV 97.9 98.1 97.4 99.2 99.7  PLT 175 196 229 249 741   Basic Metabolic Panel: Recent Labs  Lab 10/17/17 0320 10/17/17 1502 10/18/17 0843 10/19/17 0352 10/20/17 0317 10/20/17 1350 10/21/17 0341  NA 148* 147* 148* 147* 150* 151* 145  K 3.6 4.0 4.4 4.2 4.1 4.4 4.2  CL 103 97* 100 99 99 100 97*  CO2 36* 37* 37* 37* 40* 41* 38*  GLUCOSE 59* 214* 148* 73 80 135* 311*  BUN 42* 36* 34* 34* 26* 21 21  CREATININE 0.76 0.90 0.73 0.80 0.72 0.73 0.69  CALCIUM 8.7* 8.5* 8.7* 8.6* 8.7* 8.5* 8.4*  MG 2.3 2.0 2.0 2.2  --   --   --     Liver Function Tests: No results for input(s): AST, ALT, ALKPHOS, BILITOT, PROT, ALBUMIN in the last 168 hours. No results for input(s): LIPASE, AMYLASE in the last 168 hours. No results for input(s): AMMONIA in the last 168 hours. Coagulation Profile: No results for input(s): INR, PROTIME in the last 168 hours. Cardiac Enzymes: Recent Labs  Lab 10/17/17 1502  CKTOTAL 30*   BNP (last 3 results) No results for input(s): PROBNP in the last 8760 hours. CBG: Recent Labs  Lab 10/20/17 2152 10/21/17 0548 10/21/17 0632 10/21/17 0725 10/21/17 1224  GLUCAP 162* 286* 263* 233* 100*   Studies: No results found.  Scheduled Meds: . arformoterol  15 mcg Nebulization BID  . atorvastatin  40 mg Oral Daily  . budesonide (PULMICORT) nebulizer solution  0.5 mg Nebulization BID  . chlorhexidine  15 mL Mouth Rinse BID  . digoxin  0.125 mg Oral Daily  . enoxaparin (LOVENOX) injection  40 mg Subcutaneous Q24H  . famotidine  20 mg Oral Q12H  . Gerhardt's butt cream   Topical TID  . guaiFENesin  1,200 mg Oral BID  .  ipratropium-albuterol  3 mL Nebulization TID  . mouth rinse  15 mL Mouth Rinse q12n4p  . mirtazapine  30 mg Oral QHS   Continuous Infusions: . cefTRIAXone (ROCEPHIN)  IV Stopped (10/21/17 1123)   PRN Meds: acetaminophen **OR** acetaminophen, clonazePAM, diltiazem, guaiFENesin-dextromethorphan, ipratropium-albuterol, metoprolol tartrate, morphine, ondansetron **OR** ondansetron (ZOFRAN) IV, senna-docusate, sodium chloride  Time spent: 35 minutes  Author: Berle Mull, MD [facility] Pager: [phone] 10/21/2017 5:31 PM  If 7PM-7AM, please contact night-coverage at [email or web address], password [facility]

## 2017-10-21 NOTE — Progress Notes (Signed)
MD paged about pt's morning lab blood sugar-311. Rechecked and got a capillary blood sugar 286. Stopped D10 infusion.  Orders received to recheck blood sugar in 30 minutes.

## 2017-10-21 NOTE — Consult Note (Signed)
[facility].  Met with daughter who lives with pt. She is awake today and interacting with [place]. She wants to go home. They have a triology machine at home already. The daughter will bring this to the hospital and I will coordinate care with [facility] to come hook pt up to the machine so that she will be able to have it to go home on. Equipment has been ordered to accommodate 15 liters of bleed in oxygen, hospital bed, OBT, BSC, and wheelchair. Once [facility] has fitted her and made the adjustments to the triology machine to equal the bipap setting she is on now. Equipment is to be delivered at some time tomorrow. Respiratory therapy with [facility] is to call me with a time tomorrow that we can meet to make this happen. I will let case manager know as soon as I know this time. Please call with any questions. Webb Silversmith RN [phone]

## 2017-10-21 NOTE — Progress Notes (Signed)
10/21/17 1200  Clinical Encounter Type  Visited With Family  Visit Type Initial;Psychological support;Spiritual support;Critical Care  Referral From Chaplain  Consult/Referral To Chaplain  Spiritual Encounters  Spiritual Needs Emotional;Other (Comment) (Spiritual Care conversation/support)  Stress Factors  Patient Stress Factors None identified  Family Stress Factors Health changes;Major life changes   I visited with the patient's daughter per referral from the night Chaplain. The patient's daughter was at the bedside and did not state any needs at this time.   Please, contact Spiritual Care for further assistance.   Chaplain Shanon Ace M.Div., [facility]

## 2017-10-21 NOTE — Progress Notes (Signed)
Daily Progress Note   Patient Name: Vanessa Alexander       Date: 10/21/2017 DOB: Nov 25, 1955  Age: 62 y.o. MRN#: [id number] Attending Physician: Lavina Hamman, MD Primary Care Physician: Nona Dell, Corene Cornea, MD Admit Date: 10/10/2017  Reason for Consultation/Follow-up: Establishing goals of care and Non pain symptom management  Subjective: Sitting in bed wearing Bipap on entering room.    Lethargic today.  Daughter at bedside.    Discussed plan for home with hospice.  Length of Stay: 11  Current Medications: Scheduled Meds:  . arformoterol  15 mcg Nebulization BID  . atorvastatin  40 mg Oral Daily  . budesonide (PULMICORT) nebulizer solution  0.5 mg Nebulization BID  . chlorhexidine  15 mL Mouth Rinse BID  . digoxin  0.125 mg Oral Daily  . enoxaparin (LOVENOX) injection  40 mg Subcutaneous Q24H  . famotidine  20 mg Oral Q12H  . Gerhardt's butt cream   Topical TID  . guaiFENesin  1,200 mg Oral BID  . ipratropium-albuterol  3 mL Nebulization TID  . mouth rinse  15 mL Mouth Rinse q12n4p  . mirtazapine  30 mg Oral QHS    Continuous Infusions: . cefTRIAXone (ROCEPHIN)  IV Stopped (10/21/17 1123)    PRN Meds: acetaminophen **OR** acetaminophen, clonazePAM, diltiazem, guaiFENesin-dextromethorphan, ipratropium-albuterol, metoprolol tartrate, morphine, ondansetron **OR** ondansetron (ZOFRAN) IV, senna-docusate, sodium chloride  Physical Exam      General: Alert, awake, in bed with Bipap in place.  HEENT: No bruits, no goiter, no JVD Heart: Irregular rhythm. No murmur appreciated. Lungs: Coarse in bases, diminished air movement Abdomen: Soft, nontender, nondistended, positive bowel sounds.  Ext: 1+ edema Skin: Warm and dry Neuro: Grossly intact, nonfocal.     Vital Signs: BP  106/61 (BP Location: Left Arm)   Pulse (!) 115   Temp (!) 97.5 F (36.4 C) (Axillary)   Resp 20   Ht 4' 11" (1.499 m)   Wt 85.6 kg (188 lb 11.4 oz)   SpO2 93%   BMI 38.12 kg/m  SpO2: SpO2: 93 % O2 Device: O2 Device: Bi-PAP O2 Flow Rate: O2 Flow Rate (L/min): 15 L/min  Intake/output summary:   Intake/Output Summary (Last 24 hours) at 10/21/2017 2312 Last data filed at 10/21/2017 1500 Gross per 24 hour  Intake 700 ml  Output 328 ml  Net 372  ml   LBM: Last BM Date: 10/19/17 Baseline Weight: Weight: 77.1 kg (170 lb) Most recent weight: Weight: 85.6 kg (188 lb 11.4 oz)       Palliative Assessment/Data:      Patient Active Problem List   Diagnosis Date Noted  . Goals of care, counseling/discussion   . Palliative care encounter   . SOB (shortness of breath)   . AF (paroxysmal atrial fibrillation) ([place]) 10/13/2017  . Pneumococcal bacteremia 10/11/2017  . HCAP (healthcare-associated pneumonia) 10/11/2017  . Acute on chronic diastolic CHF (congestive heart failure) ([place]) 10/11/2017  . COPD (chronic obstructive pulmonary disease) ([place]) 10/11/2017  . HTN (hypertension) 10/11/2017  . AKI (acute kidney injury) ([place]) 10/11/2017  . DM (diabetes mellitus) ([place]) 10/11/2017  . Lactic acidosis 10/11/2017  . Elevated troponin 10/11/2017  . Hyperkalemia 10/11/2017  . Acute on chronic respiratory failure ([place]) 10/10/2017    Palliative Care Assessment & Plan   Patient Profile: 62 y.o. female  with past medical history of COPD oxygen been on 6 L, hypertension, CHF, diabetes type 2, SVT with MAT admitted on 10/10/2017 with shortness of breath and COPD exacerbation.  Palliative consulted for goals of care.  Assessment: Patient Active Problem List   Diagnosis Date Noted  . Goals of care, counseling/discussion   . Palliative care encounter   . SOB (shortness of breath)   . AF (paroxysmal atrial fibrillation) ([place]) 10/13/2017  . Pneumococcal bacteremia 10/11/2017  . HCAP  (healthcare-associated pneumonia) 10/11/2017  . Acute on chronic diastolic CHF (congestive heart failure) ([place]) 10/11/2017  . COPD (chronic obstructive pulmonary disease) ([place]) 10/11/2017  . HTN (hypertension) 10/11/2017  . AKI (acute kidney injury) ([place]) 10/11/2017  . DM (diabetes mellitus) ([place]) 10/11/2017  . Lactic acidosis 10/11/2017  . Elevated troponin 10/11/2017  . Hyperkalemia 10/11/2017  . Acute on chronic respiratory failure ([place]) 10/10/2017    Recommendations/Plan: - For anxiety: Continue Remeron to 27m QHS.  Continue Klonopin 0.26mQ12hours prn.  - For dyspnea: Morphine 2.66m58m 66mg61mery 4 hours prn.  Bipap as needed for comfort. - Plan for home with hospice tomorrow with HOP (if clinically stable enough to do so).  Will need to continue Bipap/trilogy on discharge.    Prognosis:   < 6 months  Discharge Planning:  Home with Hospice  Care plan was discussed with patient, RN  Thank you for allowing the Palliative Medicine Team to assist in the care of this patient.   Total Time 20 Prolonged Time Billed No      Greater than 50%  of this time was spent counseling and coordinating care related to the above assessment and plan.  GeneMicheline Rough  Please contact Palliative Medicine Team phone at [phone] questions and concerns.

## 2017-10-21 NOTE — Progress Notes (Signed)
Rechecked BS at 0630. Results were -263. MD paged about blood sugar. Received orders to wait until morning insulin dose to correct. RN will continue to monitor.

## 2017-10-21 NOTE — Progress Notes (Signed)
PULMONARY / CRITICAL CARE MEDICINE   Name: Vanessa Alexander MRN: [id number] DOB: [birth year]    ADMISSION DATE:  10/10/2017 CONSULTATION DATE:  10/12/2017  REFERRING MD:  Grandville Silos  CHIEF COMPLAINT:  Dyspnea  HISTORY OF PRESENT ILLNESS:   62 y/o female with severe COPD on 6L [place] who has experienced multiple hospitalizations for her lung problems was admitted on 6/27 with acute on chronic respiratory failure with hypoxemia due to strep pneumonia.    PAST MEDICAL HISTORY :  She  has a past medical history of CHF (congestive heart failure) ([place]), COPD (chronic obstructive pulmonary disease) ([place]), Cor pulmonale (chronic) ([place]), Diabetes mellitus without complication ([place]), Hypertension, Paroxysmal atrial fibrillation ([place]), and Pulmonary hypertension ([place]).   SUBJECTIVE:  Wearing BIPAP nearly all day Awaiting hospice placement  VITAL SIGNS: BP (!) 105/46   Pulse 75   Temp 98 F (36.7 C) (Axillary)   Resp 17   Ht _0  (1.499 m)   Wt 188 lb 11.4 oz (85.6 kg)   SpO2 96%   BMI 38.12 kg/m   HEMODYNAMICS:    VENTILATOR SETTINGS: FiO2 (%):  [65 %] 65 %  INTAKE / OUTPUT: I/O last 3 completed shifts: In: 1479.4 [I.V.:1289.2; IV Piggyback:190.2] Out: 215 [Urine:215]  PHYSICAL EXAMINATION:  General:  Resting comfortably in bed HENT: NCAT BIPAP mask in place PULM: no wheezing, poor air movement, normal effort CV: RRR, no mgr GI: BS+, soft, nontender MSK: normal bulk and tone Neuro: drowsy, MAEW     LABS:  BMET Recent Labs  Lab 10/20/17 0317 10/20/17 1350 10/21/17 0341  NA 150* 151* 145  K 4.1 4.4 4.2  CL 99 100 97*  CO2 40* 41* 38*  BUN 26* 21 21  CREATININE 0.72 0.73 0.69  GLUCOSE 80 135* 311*    Electrolytes Recent Labs  Lab 10/17/17 1502 10/18/17 0843 10/19/17 0352 10/20/17 0317 10/20/17 1350 10/21/17 0341  CALCIUM 8.5* 8.7* 8.6* 8.7* 8.5* 8.4*  MG 2.0 2.0 2.2  --   --   --     CBC Recent Labs  Lab 10/17/17 0320 10/19/17 0352 10/20/17 0317   WBC 10.3 10.0 9.3  HGB 10.7* 11.3* 11.2*  HCT 36.9 39.3 39.4  PLT 229 249 221    Coag's No results for input(s): APTT, INR in the last 168 hours.  Sepsis Markers No results for input(s): LATICACIDVEN, PROCALCITON, O2SATVEN in the last 168 hours.  ABG No results for input(s): PHART, PCO2ART, PO2ART in the last 168 hours.  Liver Enzymes No results for input(s): AST, ALT, ALKPHOS, BILITOT, ALBUMIN in the last 168 hours.  Cardiac Enzymes No results for input(s): TROPONINI, PROBNP in the last 168 hours.  Glucose Recent Labs  Lab 10/20/17 1211 10/20/17 1656 10/20/17 2152 10/21/17 0548 10/21/17 0632 10/21/17 0725  GLUCAP 127* 131* 162* 286* 263* 233*    Imaging No results found.   STUDIES:  06/2017 CT chest showed emphysema worse in upper lobes, cardiomegally, pulmonary vascular enlargement 09/2017 CXR images showing emphysema, cardiomegally, pulmonary vascular enlargement, bibasilar infiltrates 7/4 CXR > cephalization, bibasilar infiltrates unchanged  CULTURES: 6/27 blood > strep pneumo 6/30 blood > negative  ANTIBIOTICS: 6/27 vanc > x1 6/27 cefepime > x1 6/28 ceftriaxone >   SIGNIFICANT EVENTS:   LINES/TUBES:   DISCUSSION: 62 y/o female with advanced chronic respiratory failure with hypercarbia and hypoxemia due to emphysema and CHF admitted for another acute exacerbation of respiratory failure due to strep pneumo pneumonia.   As of 7/6 her plan is to be discharged on  hospice.   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 I have encouraged the patient and her daughter to use morphine more for relief of work of breathing but the patient prefers the BIPAP machine for this which is reasonable as it provides adequate relief.  If dyspnea worsens then use more morphine (family and patient acknowledged this plan).    PCCM will be available prn  Roselie Awkward, MD [place] [facility] Pager: [phone] Cell: [phone] After 3pm or if no response, call [phone]   10/21/2017, 11:47 AM

## 2017-10-22 LAB — BASIC METABOLIC PANEL
Anion gap: 11 (ref 5–15)
BUN: 18 mg/dL (ref 8–23)
CALCIUM: 8.6 mg/dL — AB (ref 8.9–10.3)
CO2: 36 mmol/L — AB (ref 22–32)
CREATININE: 0.61 mg/dL (ref 0.44–1.00)
Chloride: 100 mmol/L (ref 98–111)
GFR calc non Af Amer: >60 mL/min (ref >60)
Glucose, Bld: 198 mg/dL — ABNORMAL HIGH (ref 70–99)
Potassium: 4 mmol/L (ref 3.5–5.1)
SODIUM: 147 mmol/L — AB (ref 135–145)

## 2017-10-22 MED ORDER — GERHARDT'S BUTT CREAM: 1.0000 "application " | TOPICAL_CREAM | Freq: Three times a day (TID) | CUTANEOUS | 0 refills | Status: AC

## 2017-10-22 MED ORDER — CLONAZEPAM 0.5 MG PO TABS: 0.2500 mg | ORAL_TABLET | Freq: Two times a day (BID) | ORAL | 0 refills | Status: AC | PRN

## 2017-10-22 MED ORDER — MORPHINE SULFATE 10 MG/5ML PO SOLN: 2.5000 mg | ORAL | 0 refills | Status: AC | PRN

## 2017-10-22 MED ORDER — ARFORMOTEROL TARTRATE 15 MCG/2ML IN NEBU: 15.0000 ug | INHALATION_SOLUTION | Freq: Two times a day (BID) | RESPIRATORY_TRACT | 0 refills | Status: AC

## 2017-10-22 MED ORDER — GUAIFENESIN-DM 100-10 MG/5ML PO SYRP: 5.0000 mL | ORAL_SOLUTION | ORAL | 0 refills | Status: AC | PRN

## 2017-10-22 MED ORDER — DICLOFENAC SODIUM 1 % TD GEL
2.0000 g | Freq: Four times a day (QID) | TRANSDERMAL | Status: DC
Start: 2017-10-22 — End: 2017-10-22
  Administered 2017-10-22: 2 g via TOPICAL
  Filled 2017-10-22: qty 100

## 2017-10-22 NOTE — Care Management Note (Signed)
Case Management Note  Patient Details  Name: Vanessa Alexander MRN: [id number] Date of Birth: [birth year]  Subjective/Objective:                  Spoke to [facility] about d/c today home w/hospice-Trilogy  Has battery pack-[facility] will come to rm@ 2p to change settings, all home dme has been delivered. CM will call [facility] once ready. DNR from on shadow chart.  Action/Plan:d/c home w/hospice/dme.   Expected Discharge Date:  10/14/2017               Expected Discharge Plan:  Home w Hospice Care  In-House Referral:  NA  Discharge planning Services  CM Consult  Post Acute Care Choice:  Hospice Choice offered to:  Adult Children  DME Arranged:  Other see comment DME Agency:  [place]:  RN [facility] Agency:  [place]  Status of Service:  Completed, signed off  If discussed at [place] of Stay Meetings, dates discussed:    Additional Comments:  Dessa Phi, RN 11/04/2017, 1:39 PM

## 2017-10-22 NOTE — Discharge Summary (Signed)
[facility] Discharge Summary   Patient: Vanessa Alexander ZOX:[id number]   PCP: Townsend Roger, MD DOB: 1955-09-03   Date of admission: 10/10/2017   Date of discharge:  11/09/2017    Discharge Diagnoses:  Principal Problem:   Pneumococcal bacteremia Active Problems:   Acute on chronic respiratory failure ([place])   HCAP (healthcare-associated pneumonia)   Acute on chronic diastolic CHF (congestive heart failure) (HCC)   COPD (chronic obstructive pulmonary disease) (HCC)   HTN (hypertension)   AKI (acute kidney injury) ([place])   DM (diabetes mellitus) (HCC)   Lactic acidosis   Elevated troponin   Hyperkalemia   AF (paroxysmal atrial fibrillation) (HCC)   SOB (shortness of breath)   Goals of care, counseling/discussion   Palliative care encounter  Admitted From: home Disposition:  Home with hospice  Recommendations for Outpatient Follow-up:  1. Please establish care with inpatient hospice   Follow-up Information    [facility] Follow up.   Why:  Home Hospice RN - agency will call to arrange appointment Contact information: [address] [place] [address] [phone]          Diet recommendation: comfort feeds  Activity: The patient is advised to gradually reintroduce usual activities.  Discharge Condition: stable  Code Status: DNR DNI comfort care  History of present illness: As per the H and P dictated on admission, " Vanessa Alexander is a 62 y.o. 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  Patient open BiPAP during my interview, however wake up and answer questions.  Patient was hospitalized 1 month ago at [facility] due to COPD exacerbation and pneumonia and was discharged to SNF for short-term rehab.  She just left the facility 1 day ago.  Patient reported that she was  having difficulty breathing and worsening of her baseline shortness of breath for the past week or so and apparently this was not addressed.  Patient was unable to walk around her house with her baseline 6 L nasal cannula, developing cough and generalized fatigue.  Patient denies chest pain, palpitations and dizziness."  Hospital Course:  Summary of her active problems in the hospital is as following. 1.Acute on chronic hypoxic respiratory failure. Acute COPD exacerbation. Acute on chronic diastolic CHF. Streptococcus bacteremia. Bilateral pneumonia secondary to Streptococcus pneumoniae. Presented with shortness of breath, requiring BiPAP. Uses BiPAP at night but using BiPAP throughout the day here. Stable on nonrebreather but desats on high flow nasal cannula. Started with IV vancomycin and IV cefepime, blood cultures were positive for Streptococcus pneumoniae. Infectious disease was consulted. Currently on IV ceftriaxone only, ID recommends to complete 10-day treatment course, PCCM recommends 14-day treatment course.  We will continue while patient is in the hospital.  Last day 10/23/2017. Patient was also given IV Solu-Medrol, now titrating down to oral prednisone. Given high-dose IV Lasix initially with worsening renal function transition back to oral Lasix. Back on IV lasix. Switch to oral. Now on hold. Palliative care consulted for goals of care discussion since patient has large component of anxiety contributing to her respiratory distress. Echocardiogram April 2019 shows preserved EF, diastolic dysfunction, severe right-sided dysfunction as well as pulmonary hypertension. Repeat Echocardiogram shows preserved EF Continue Mucinex, duo nebs, pulmonary toilet, Pulmicort and Brovana.  2.Acute kidney injury. Hyperkalemia Hypernatremia Multifactorial, initially cardiorenal later on with diuresis. Now renal function  back to normal. Monitor. Potassium corrected with Kayexalate. D10  on hold.  3.Elevated troponin. HTN. SVT Due to demand ischemia from acute hypoxia as well as CHF exacerbation. Hold Cardizem, Toprol-XL and Lasix. Cardizem dose increased from 180-240 tolerating it very well. Digoxin level therapeutic. Heart rate currently appears to have well controlled. PRN IV Lopressor for SVTs.  4.Type 2 diabetes mellitus, uncontrolled.  No complication.  Long-term steroid use. Prior hemoglobin A1c 7.2. Currently uncontrolled in the hospital with hyper and hypoglycemia. Patient blood sugar was significantly elevated due to steroids requiring Levemir 20 mg twice a day as well as a resistant sliding scale as well as scheduled pre-meal coverage. Now that the patient is on 20 mg prednisone her insulin requirement has gone down and the patient had an episode of hypoglycemia on 10/17/2017 and on 10/19/2017. Discontinue scheduled Levemir and just continue sliding scale for now.  5.Anxiety Mood disorder. Remeron increased to 30 mg nightly. Benzodiazepine switch to Klonopin.  6.Respiratory distress. Palliative care consulted. Morphine dose adjusted although the patient still is not using this medication. Prefer to use this medication if the patient is stable enough to use it during the daytime for respiratory distress as opposed to going back on BiPAP.  7.Goals of care discussion. Palliative care consulted, appreciate their assistance. Family meeting return on 10/19/2017. Patient herself expressed her wishes that she does not want to be resuscitated or intubated. She also requested that she thinks hospice would be a better option if they would take BiPAP. CCM, palliative care as well as myself were present during the meeting.  Patient had contacted [facility] [place] in the past, we discussed with them and they agreed to think the patient home with BiPAP.  Discussed with patient's daughter as well as palliative care. Currently on working to our goal of  going home with hospice.  8. Bilateral leg weakness RN called to notify bilateral leg weakness that she noted on shift change I evaluated the pt at bedside, pt has atleast 4/5 bilateral leg strength without any other focal deficit. This appear unchanged from my prior eval.  A STAT CT HEAD also negative for any acute abnormality  Likely due to pt receiving morphine and Klonopin causing her to have generalized weakness.  Discontinued buspar.  9. Sacral ulcers Pt has multiple small punched out ulcers on her sacrum.  None of them appear infected.  Appears to present on admission  Mildly elevated ESR CRP Wound care consult appreciated,  Continue foam dressing and Gerhardt's butt cream.  10. essential hypertension. Hypotension. Chronic persistent A. Fib. Patient was on Cardizem and Lopressor. Due to hypotension these medications are currently on hold. Now the patient is going home with hospice for comfort care, anticipate rapid decline.  Management per hospice.  Home hospice was arranged at patient's request, patient was adamant about going home only, inpatient hospice was considered but patient's wishes were priority.  on the day of the discharge the patient's symptoms were controlled, and no other acute medical condition were reported by patient. the patient was felt safe to be discharge at home with home hospice.  Consultants: PCCM Palliative care  Infectious disease Procedures: Echocardiogram BiPAP  DISCHARGE MEDICATION: Allergies as of 10/17/2017      Reactions   Lisinopril Swelling   Codeine Rash      Medication List    STOP taking these medications   acetaminophen 650 MG CR tablet Commonly known as:  TYLENOL   ALPRAZolam 0.25 MG tablet Commonly known  as:  XANAX   atorvastatin 40 MG tablet Commonly known as:  LIPITOR   busPIRone 10 MG tablet Commonly known as:  BUSPAR   diltiazem 180 MG 24 hr capsule Commonly known as:  DILACOR XR   famotidine 20 MG  tablet Commonly known as:  PEPCID   furosemide 40 MG tablet Commonly known as:  LASIX   insulin detemir 100 UNIT/ML injection Commonly known as:  LEVEMIR   insulin lispro 100 UNIT/ML injection Commonly known as:  HUMALOG   levalbuterol 0.63 MG/3ML nebulizer solution Commonly known as:  XOPENEX   magnesium oxide 400 MG tablet Commonly known as:  MAG-OX   metFORMIN 500 MG tablet Commonly known as:  GLUCOPHAGE   metoprolol succinate 25 MG 24 hr tablet Commonly known as:  TOPROL-XL   morphine 15 MG tablet Commonly known as:  MSIR Replaced by:  morphine 10 MG/5ML solution   oxyCODONE-acetaminophen 5-325 MG tablet Commonly known as:  PERCOCET/ROXICET   pantoprazole 40 MG tablet Commonly known as:  PROTONIX   potassium chloride SA 20 MEQ tablet Commonly known as:  K-DUR,KLOR-CON   predniSONE 10 MG tablet Commonly known as:  DELTASONE   rivaroxaban 20 MG Tabs tablet Commonly known as:  XARELTO   sennosides-docusate sodium 8.6-50 MG tablet Commonly known as:  SENOKOT-S     TAKE these medications   arformoterol 15 MCG/2ML Nebu Commonly known as:  BROVANA Take 2 mLs (15 mcg total) by nebulization 2 (two) times daily.   budesonide 0.5 MG/2ML nebulizer solution Commonly known as:  PULMICORT Take 0.5 mg by nebulization every 12 (twelve) hours.   clonazePAM 0.5 MG tablet Commonly known as:  KLONOPIN Take 0.5 tablets (0.25 mg total) by mouth 2 (two) times daily as needed (anxiety).   digoxin 0.125 MG tablet Commonly known as:  LANOXIN Take 0.125 mg by mouth daily.   Gerhardt's butt cream Crea Apply 1 application topically 3 (three) times daily.   guaiFENesin 600 MG 12 hr tablet Commonly known as:  MUCINEX Take 600 mg by mouth 2 (two) times daily.   guaiFENesin-dextromethorphan 100-10 MG/5ML syrup Commonly known as:  ROBITUSSIN DM Take 5 mLs by mouth every 4 (four) hours as needed for cough.   ipratropium-albuterol 0.5-2.5 (3) MG/3ML Soln Commonly known as:   DUONEB Take 2.5 mLs by nebulization every 4 (four) hours while awake.   mirtazapine 15 MG tablet Commonly known as:  REMERON Take 15 mg by mouth at bedtime.   morphine 10 MG/5ML solution Take 1.3-2.5 mLs (2.6-5 mg total) by mouth every 4 (four) hours as needed (pain or shortness of breath). Replaces:  morphine 15 MG tablet      Allergies  Allergen Reactions  . Lisinopril Swelling  . Codeine Rash   Discharge Instructions    Diet general   Complete by:  As directed    Increase activity slowly   Complete by:  As directed      Discharge Exam: Filed Weights   10/20/17 0500 10/21/17 0500 10/30/2017 0612  Weight: 82.6 kg (182 lb 1.6 oz) 85.6 kg (188 lb 11.4 oz) 85.9 kg (189 lb 6 oz)   Vitals:   10/29/2017 0825 10/19/2017 1120  BP: (!) 80/57   Pulse: (!) 142 (!) 112  Resp: 19 18  Temp:    SpO2: 90% 91%   General: Appear in moderate distress, no Rash; Oral Mucosa moist. Cardiovascular: S1 and S2 Present, no Murmur, no JVD Respiratory: Bilateral Air entry present and bilateral Crackles, bilateral wheezes Abdomen: Bowel Sound  present, Soft and no tenderness Extremities: no Pedal edema, no calf tenderness Neurology: Grossly no focal neuro deficit.  The results of significant diagnostics from this hospitalization (including imaging, microbiology, ancillary and laboratory) are listed below for reference.    Significant Diagnostic Studies: Ct Head Wo Contrast  Result Date: 10/17/2017 CLINICAL DATA:  Fatigue, ataxia EXAM: CT HEAD WITHOUT CONTRAST TECHNIQUE: Contiguous axial images were obtained from the base of the skull through the vertex without intravenous contrast. COMPARISON:  None. FINDINGS: Brain: No acute intracranial abnormality. Specifically, no hemorrhage, hydrocephalus, mass lesion, acute infarction, or significant intracranial injury. Vascular: No hyperdense vessel or unexpected calcification. Skull: No acute calvarial abnormality. Sinuses/Orbits: No acute findings Other: None  IMPRESSION: No acute intracranial abnormality. Electronically Signed   By: Rolm Baptise M.D.   On: 10/17/2017 15:27   Dg Chest Port 1 View  Result Date: 10/19/2017 CLINICAL DATA:  Acute respiratory failure with hypoxia. EXAM: PORTABLE CHEST 1 VIEW COMPARISON:  10/17/2017. FINDINGS: The heart is enlarged. There is mild vascular congestion without consolidation or edema. No effusion or pneumothorax. Osteopenia. Compared with prior radiograph, slight improvement in aeration and slight clearing of the linear atelectasis. IMPRESSION: Cardiomegaly.  No significant consolidation or edema. Electronically Signed   By: Staci Righter M.D.   On: 10/19/2017 07:03   Dg Chest Port 1 View  Result Date: 10/17/2017 CLINICAL DATA:  Respiratory failure. EXAM: PORTABLE CHEST 1 VIEW COMPARISON:  10/16/2017 FINDINGS: Cardiac enlargement. Pulmonary vascularity is normal. Atelectasis in the lung bases. No consolidation. No blunting of costophrenic angles. No pneumothorax. Mediastinal contours appear intact. No change since previous study. IMPRESSION: Cardiac enlargement. Linear atelectasis in the lung bases. No focal consolidation. Electronically Signed   By: Lucienne Capers M.D.   On: 10/17/2017 05:23   Dg Chest Port 1 View  Result Date: 10/16/2017 CLINICAL DATA:  Acute respiratory failure with hypoxia. EXAM: PORTABLE CHEST 1 VIEW COMPARISON:  Radiograph of October 12, 2017. FINDINGS: Stable cardiomegaly. Stable left basilar opacity is noted concerning for atelectasis or infiltrate with possible pleural effusion. Mild right basilar atelectasis or infiltrate is noted. No pneumothorax is noted. Bony thorax is unremarkable. IMPRESSION: Stable left basilar opacity consistent with atelectasis or infiltrate with possible pleural effusion. Mildly increased right basilar atelectasis or infiltrate is noted. Electronically Signed   By: Marijo Conception, M.D.   On: 10/16/2017 07:14   Dg Chest Port 1 View  Result Date: 10/12/2017 CLINICAL  DATA:  Trouble breathing for 2 days. EXAM: PORTABLE CHEST 1 VIEW COMPARISON:  October 11, 2017 FINDINGS: Cardiomegaly. Left retrocardiac opacity, unchanged. No other interval changes identified. IMPRESSION: Left retrocardiac opacity, stable.  Cardiomegaly.  No other change. Electronically Signed   By: Dorise Bullion III M.D   On: 10/12/2017 11:19   Dg Chest Port 1 View  Result Date: 10/11/2017 CLINICAL DATA:  Shortness of breath EXAM: PORTABLE CHEST 1 VIEW COMPARISON:  10/10/2017 FINDINGS: Cardiomegaly again noted. Bibasilar opacities, LEFT-greater-than-RIGHT, are unchanged. No new pulmonary opacities are identified. No pneumothorax or definite pleural effusion. IMPRESSION: Unchanged appearance of the chest with cardiomegaly and bibasilar opacities, LEFT-greater-than-RIGHT. Electronically Signed   By: Margarette Canada M.D.   On: 10/11/2017 05:45   Dg Chest Portable 1 View  Result Date: 10/10/2017 CLINICAL DATA:  Increased shortness of breath. Difficulty breathing. EXAM: PORTABLE CHEST 1 VIEW COMPARISON:  Sep 03, 2017 FINDINGS: No pneumothorax. Mild bibasilar opacities are stable on the right and more pronounced on the left in the interval. No overt edema. No change in  the cardiomediastinal silhouette. IMPRESSION: Bibasilar opacities again identified, stable on the right and more pronounced on the left. No other interval change. Electronically Signed   By: Dorise Bullion III M.D   On: 10/10/2017 12:02    Microbiology: Recent Results (from the past 240 hour(s))  Culture, blood (Routine X 2) w Reflex to ID Panel     Status: None   Collection Time: 10/13/17  3:45 PM  Result Value Ref Range Status   Specimen Description   Final    BLOOD LEFT ARM Performed at [facility] [address], [place], [place] [address]    Special Requests   Final    BOTTLES DRAWN AEROBIC AND ANAEROBIC Blood Culture adequate volume Performed at [facility] [address]  [place], [place] [address]    Culture   Final    NO GROWTH 5 DAYS Performed at [facility], [place] [address] [place], [place] [address]    Report Status 10/18/2017 FINAL  Final  Culture, blood (Routine X 2) w Reflex to ID Panel     Status: None   Collection Time: 10/13/17  3:52 PM  Result Value Ref Range Status   Specimen Description   Final    BLOOD RIGHT HAND Performed at [facility] [address], [place], [place] [address]    Special Requests   Final    BOTTLES DRAWN AEROBIC ONLY Blood Culture adequate volume Performed at [facility] [address] [place], [place] [address]    Culture   Final    NO GROWTH 5 DAYS Performed at [facility], [place] [address] [place], [place] [address]    Report Status 10/18/2017 FINAL  Final     Labs: CBC: Recent Labs  Lab 10/16/17 0323 10/17/17 0320 10/19/17 0352 10/20/17 0317  WBC 10.0 10.3 10.0 9.3  HGB 10.5* 10.7* 11.3* 11.2*  HCT 36.5 36.9 39.3 39.4  MCV 98.1 97.4 99.2 99.7  PLT 196 229 249 093   Basic Metabolic Panel: Recent Labs  Lab 10/17/17 0320 10/17/17 1502 10/18/17 0843 10/19/17 0352 10/20/17 0317 10/20/17 1350 10/21/17 0341 11/11/2017 0354  NA 148* 147* 148* 147* 150* 151* 145 147*  K 3.6 4.0 4.4 4.2 4.1 4.4 4.2 4.0  CL 103 97* 100 99 99 100 97* 100  CO2 36* 37* 37* 37* 40* 41* 38* 36*  GLUCOSE 59* 214* 148* 73 80 135* 311* 198*  BUN 42* 36* 34* 34* 26* _0 CREATININE 0.76 0.90 0.73 0.80 0.72 0.73 0.69 0.61  CALCIUM 8.7* 8.5* 8.7* 8.6* 8.7* 8.5* 8.4* 8.6*  MG 2.3 2.0 2.0 2.2  --   --   --   --    Liver Function Tests: No results for input(s): AST, ALT, ALKPHOS, BILITOT, PROT, ALBUMIN in the last 168 hours. No results for input(s): LIPASE, AMYLASE in the last 168 hours. No results for input(s): AMMONIA in the last 168 hours. Cardiac Enzymes: Recent Labs  Lab 10/17/17 1502  CKTOTAL 30*   BNP (last 3 results) Recent Labs    07/27/17 0841  10/10/17 1134  BNP 165.8* 726.3*   CBG: Recent Labs  Lab 10/21/17 0548 10/21/17 0632 10/21/17 0725 10/21/17 1224 10/21/17 1730  GLUCAP 286* 263* 233* 100* 209*   Time spent: 35 minutes  Signed:  Berle Mull  [facility]  10/29/2017  , 2:53 PM

## 2017-10-22 NOTE — Progress Notes (Signed)
Nurse Jinny Blossom will confirm which resp device patient will d/c home on-trilogy vs astrol(patient was on @ home-with [facility] per [place] resp therapist) The doctor will have a form to complete for the device-[facility] aware of current concern. Nurse will contact [facility] once the device is confirmed & form is completed. No further CM needs.

## 2017-11-14 DEATH — deceased

## 2019-06-27 IMAGING — CR DG CHEST 1V PORT
1 series · 1 of 1 positions shown · non-contrast
Comparison: Radiograph July 29, 2017.

CLINICAL DATA: Shortness of breath.

EXAM:
PORTABLE CHEST 1 VIEW

[AP]
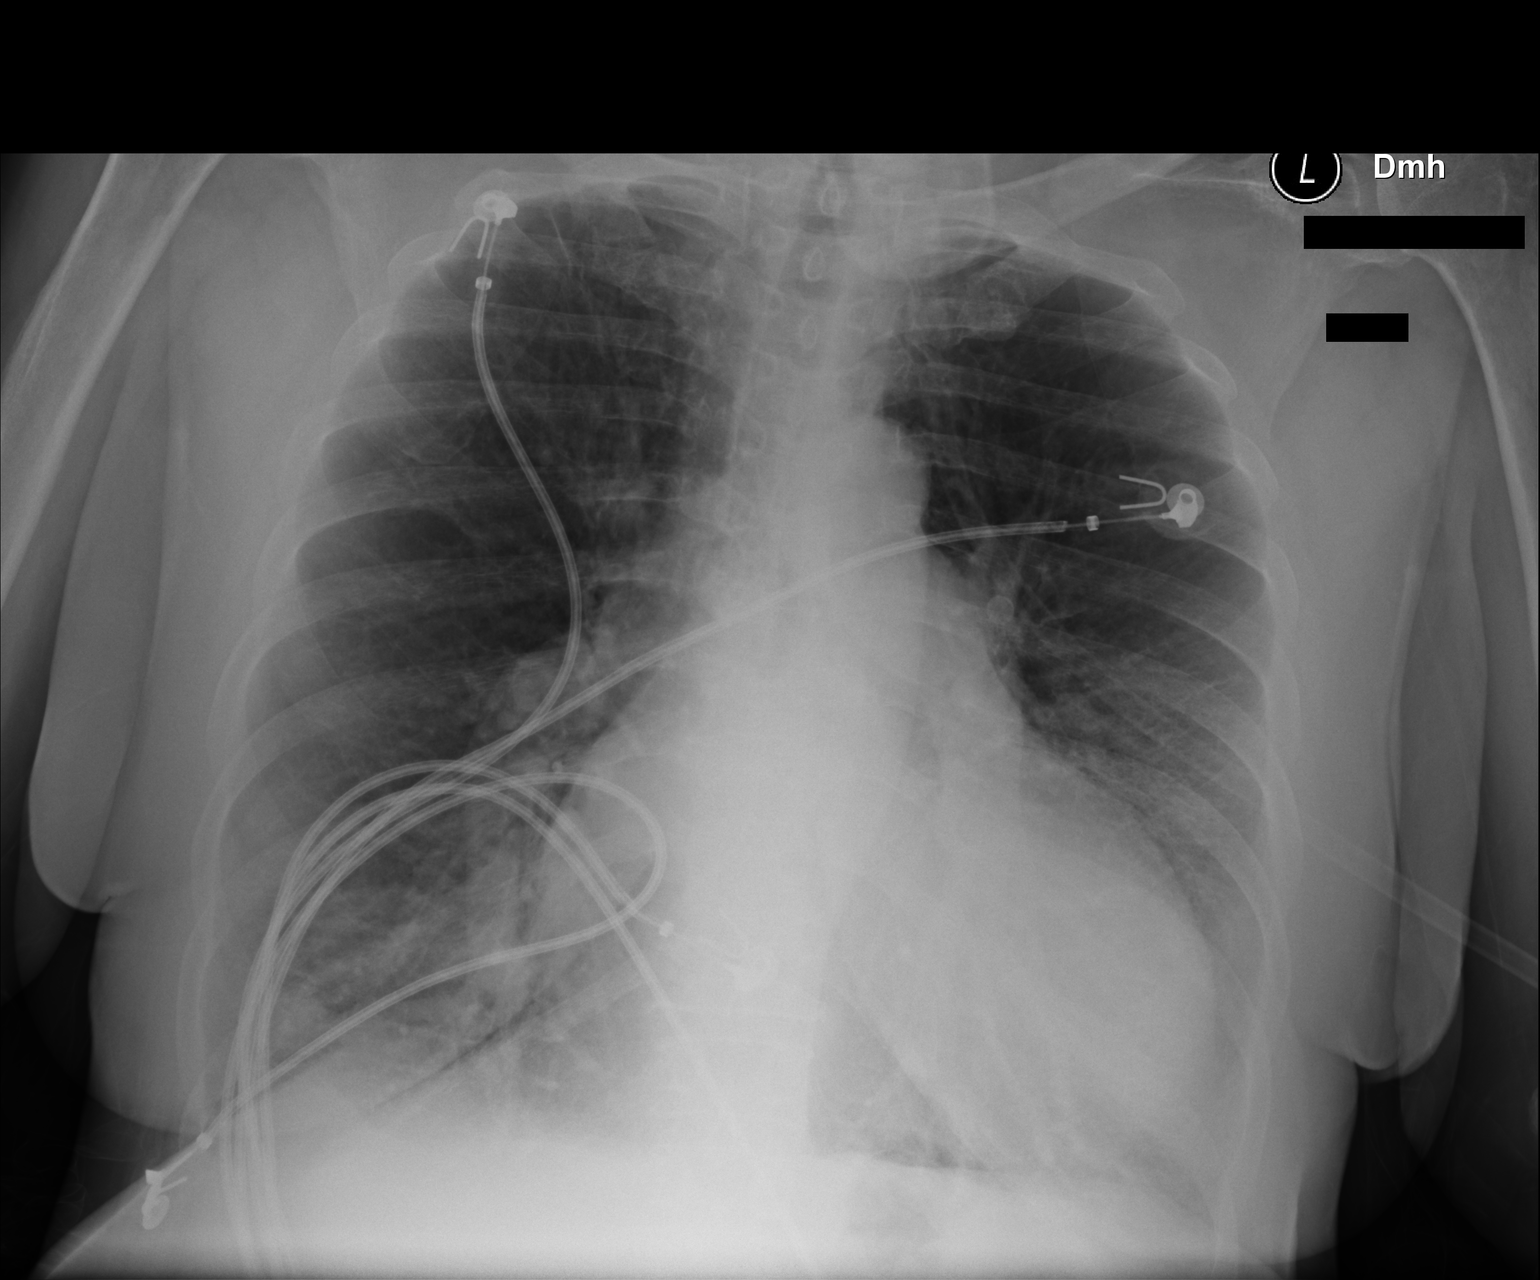

[1 of 1 positions shown; findings below may reference images not displayed]

FINDINGS: Stable cardiomegaly. No pneumothorax is noted. Improved bibasilar
opacities are noted compared to prior exam suggesting improving
edema or atelectasis. No significant pleural effusion is noted. Bony
thorax is unremarkable.
IMPRESSION: Decreased bibasilar opacities are noted suggesting improving edema
or atelectasis.

## 2019-09-05 IMAGING — DX DG CHEST 1V PORT
2 series · 2 of 2 positions shown · non-contrast
Comparison: September 03, 2017

CLINICAL DATA: Increased shortness of breath. Difficulty breathing.

EXAM:
PORTABLE CHEST 1 VIEW

[chest ap (1 of 2)]
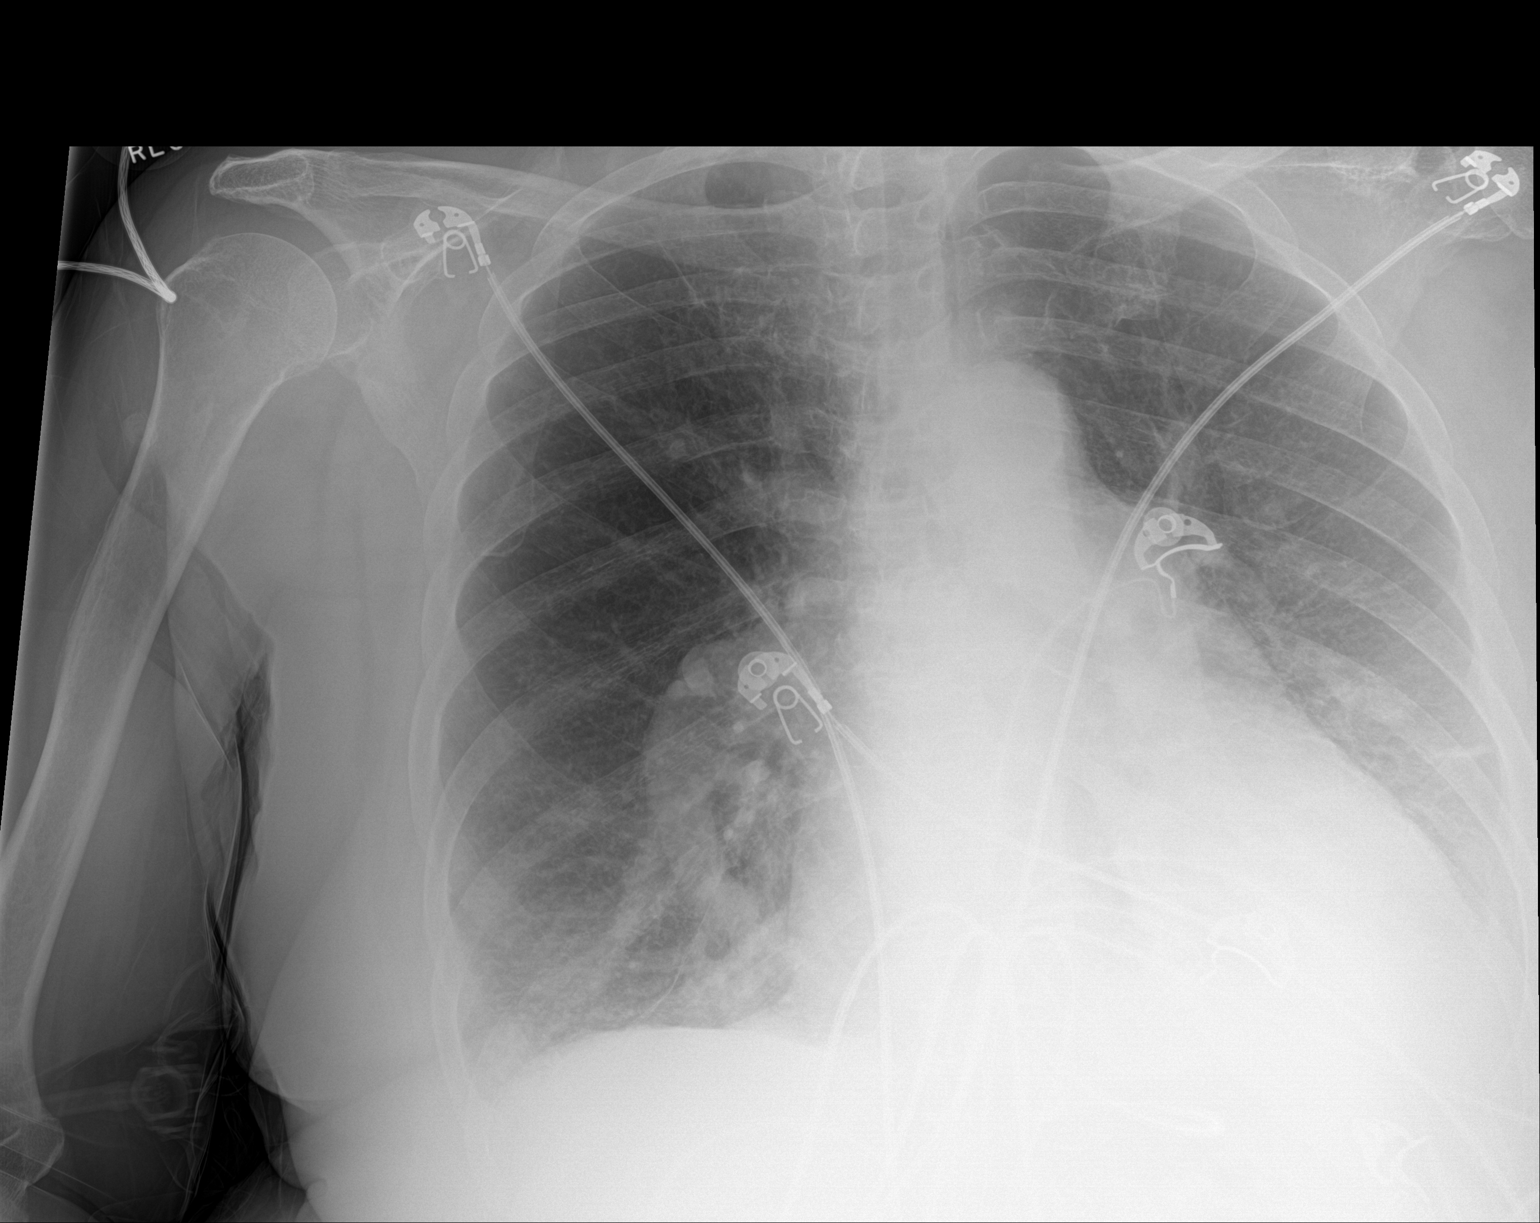

[chest ap (2 of 2)]
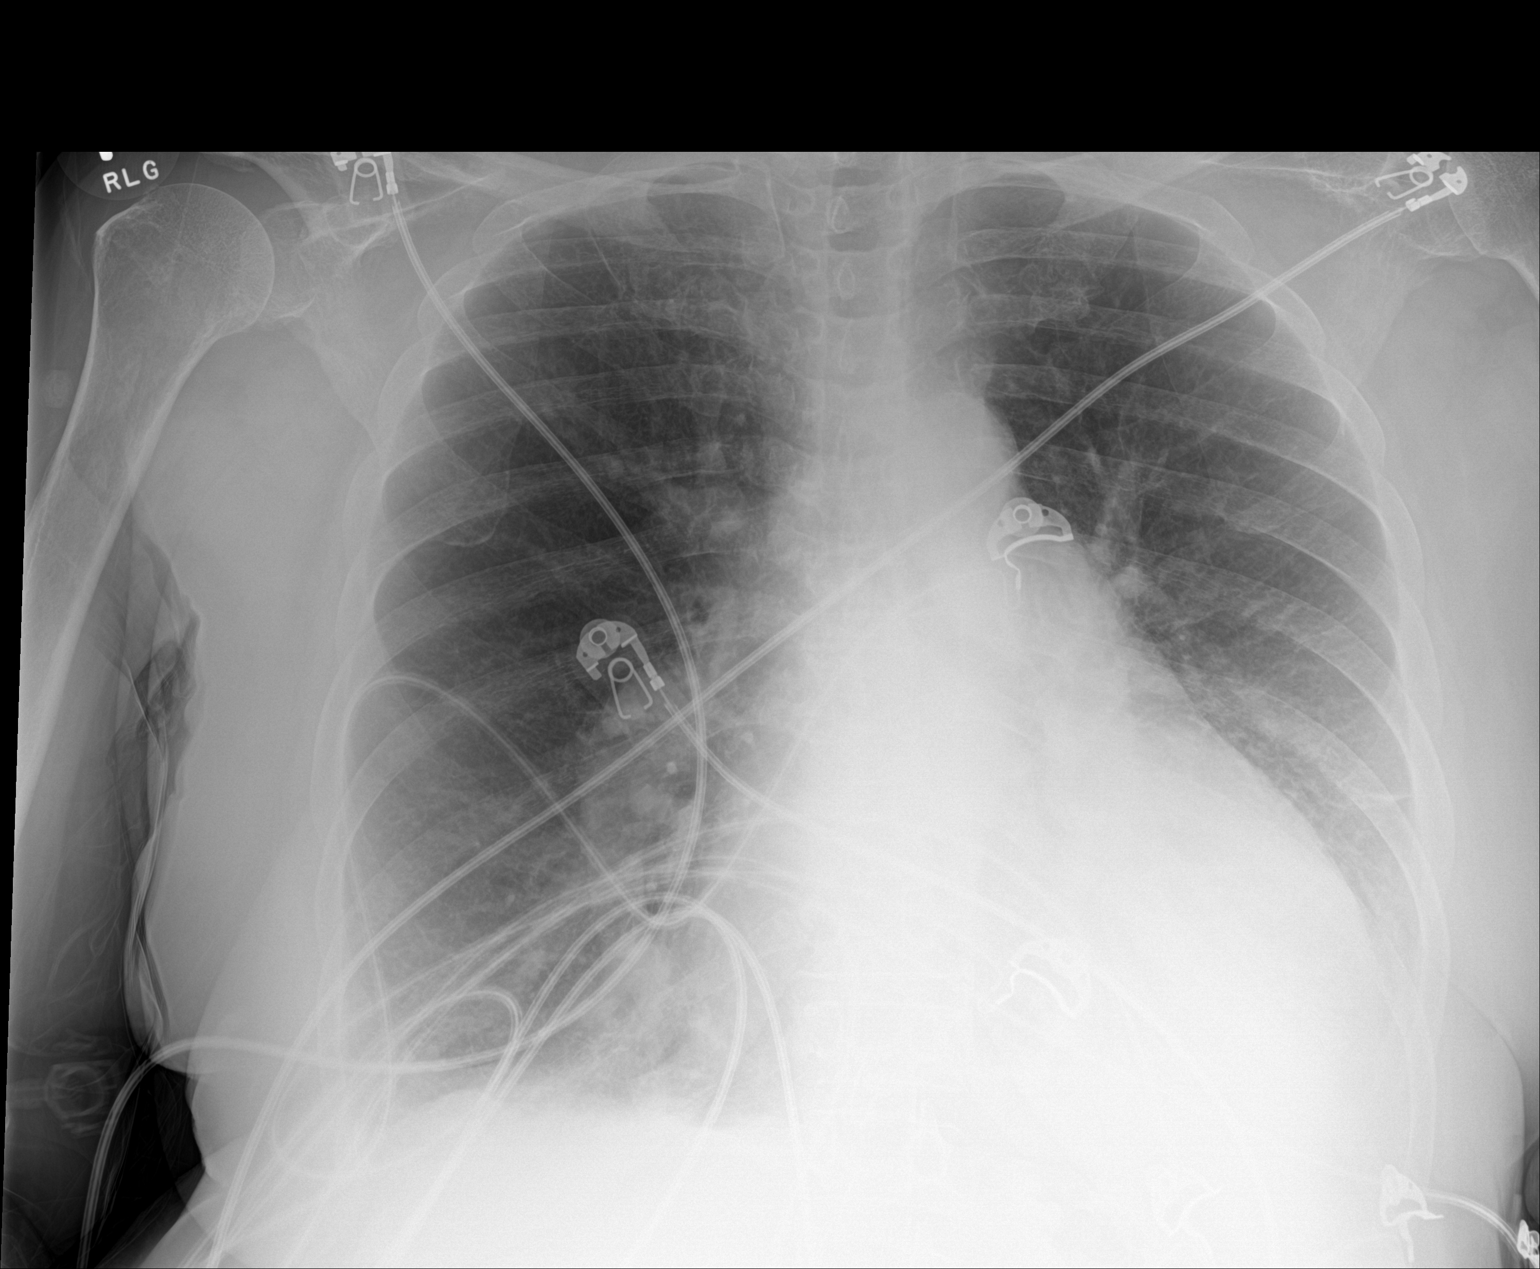

[2 of 2 positions shown; findings below may reference images not displayed]

FINDINGS: No pneumothorax. Mild bibasilar opacities are stable on the right
and more pronounced on the left in the interval. No overt edema. No
change in the cardiomediastinal silhouette.
IMPRESSION: Bibasilar opacities again identified, stable on the right and more
pronounced on the left. No other interval change.

## 2019-09-06 IMAGING — DX DG CHEST 1V PORT
2 series · 2 of 2 positions shown · non-contrast
Comparison: 10/10/2017

CLINICAL DATA: Shortness of breath

EXAM:
PORTABLE CHEST 1 VIEW

[chest ap (1 of 2)]
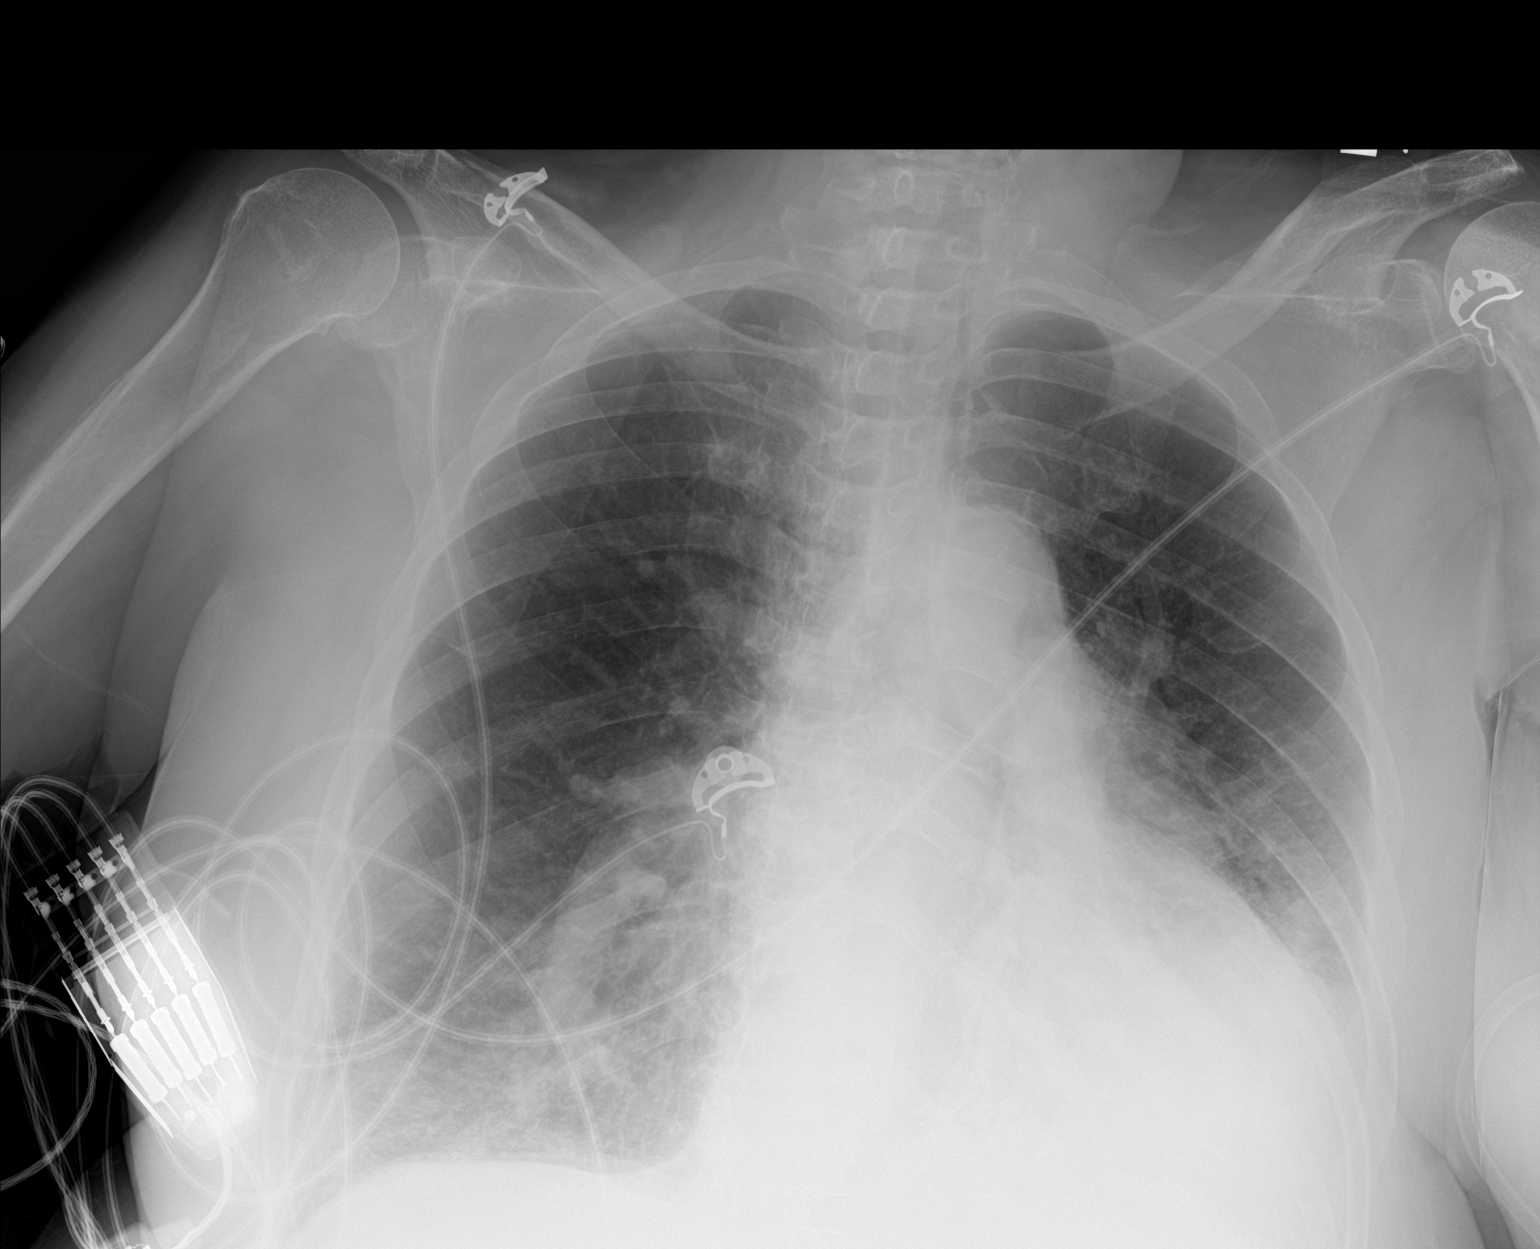

[chest ap (2 of 2)]
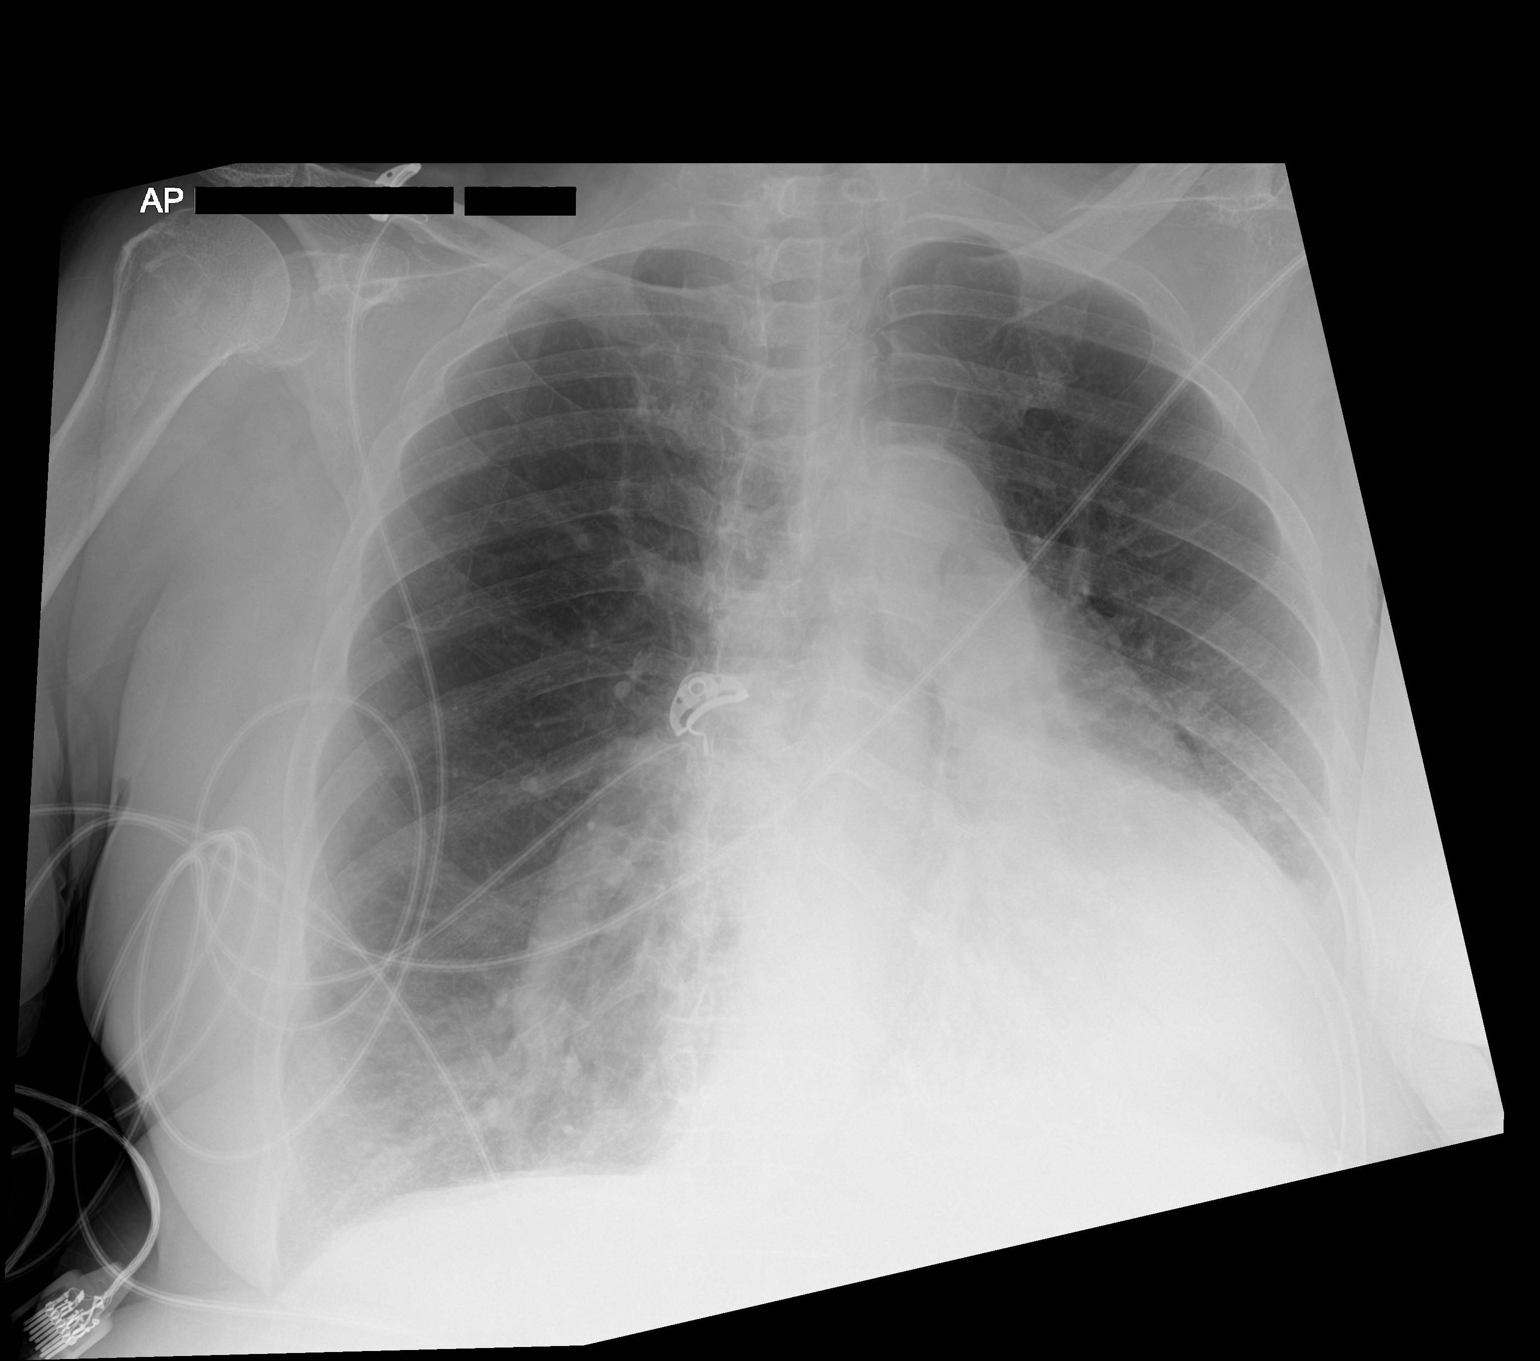

[2 of 2 positions shown; findings below may reference images not displayed]

FINDINGS: Cardiomegaly again noted.

Bibasilar opacities, LEFT-greater-than-RIGHT, are unchanged.

No new pulmonary opacities are identified.

No pneumothorax or definite pleural effusion.
IMPRESSION: Unchanged appearance of the chest with cardiomegaly and bibasilar
opacities, LEFT-greater-than-RIGHT.
